# Patient Record
Sex: Female | Born: 1995 | Race: Black or African American | Hispanic: No | Marital: Single | Smoking: Never smoker
Health system: Southern US, Community
[De-identification: ages and names within clinical notes are randomized; demographics above are authoritative.]

## PROBLEM LIST (undated history)

## (undated) ENCOUNTER — Inpatient Hospital Stay (HOSPITAL_COMMUNITY): Payer: Self-pay

## (undated) DIAGNOSIS — J302 Other seasonal allergic rhinitis: Secondary | ICD-10-CM

## (undated) DIAGNOSIS — R519 Headache, unspecified: Secondary | ICD-10-CM

## (undated) DIAGNOSIS — J45909 Unspecified asthma, uncomplicated: Secondary | ICD-10-CM

## (undated) DIAGNOSIS — R87629 Unspecified abnormal cytological findings in specimens from vagina: Secondary | ICD-10-CM

## (undated) DIAGNOSIS — A4902 Methicillin resistant Staphylococcus aureus infection, unspecified site: Secondary | ICD-10-CM

## (undated) DIAGNOSIS — R011 Cardiac murmur, unspecified: Secondary | ICD-10-CM

## (undated) DIAGNOSIS — D649 Anemia, unspecified: Secondary | ICD-10-CM

## (undated) DIAGNOSIS — F419 Anxiety disorder, unspecified: Secondary | ICD-10-CM

## (undated) DIAGNOSIS — R51 Headache: Secondary | ICD-10-CM

## (undated) HISTORY — DX: Unspecified asthma, uncomplicated: J45.909

## (undated) HISTORY — DX: Anxiety disorder, unspecified: F41.9

## (undated) HISTORY — DX: Other seasonal allergic rhinitis: J30.2

---

## 2015-05-10 ENCOUNTER — Other Ambulatory Visit (HOSPITAL_COMMUNITY): Payer: Self-pay | Admitting: Obstetrics

## 2015-05-10 DIAGNOSIS — Z3491 Encounter for supervision of normal pregnancy, unspecified, first trimester: Secondary | ICD-10-CM

## 2015-05-15 ENCOUNTER — Ambulatory Visit (HOSPITAL_COMMUNITY)
Admission: RE | Admit: 2015-05-15 | Discharge: 2015-05-15 | Disposition: A | Discharge status: Home / Self Care | Payer: Medicaid Other | Source: Ambulatory Visit | Attending: Obstetrics | Admitting: Obstetrics

## 2015-05-15 DIAGNOSIS — Z3491 Encounter for supervision of normal pregnancy, unspecified, first trimester: Secondary | ICD-10-CM

## 2015-05-15 DIAGNOSIS — Z36 Encounter for antenatal screening of mother: Secondary | ICD-10-CM | POA: Insufficient documentation

## 2015-05-15 DIAGNOSIS — Z3A09 9 weeks gestation of pregnancy: Secondary | ICD-10-CM | POA: Diagnosis not present

## 2015-07-09 LAB — OB RESULTS CONSOLE ABO/RH: RH TYPE: POSITIVE

## 2015-07-09 LAB — OB RESULTS CONSOLE GC/CHLAMYDIA
Chlamydia: NEGATIVE
GC PROBE AMP, GENITAL: NEGATIVE

## 2015-07-09 LAB — OB RESULTS CONSOLE RPR: RPR: NONREACTIVE

## 2015-07-09 LAB — OB RESULTS CONSOLE ANTIBODY SCREEN: Antibody Screen: NEGATIVE

## 2015-07-09 LAB — OB RESULTS CONSOLE HIV ANTIBODY (ROUTINE TESTING): HIV: NONREACTIVE

## 2015-07-09 LAB — OB RESULTS CONSOLE RUBELLA ANTIBODY, IGM: Rubella: IMMUNE

## 2015-07-09 LAB — OB RESULTS CONSOLE HEPATITIS B SURFACE ANTIGEN: Hepatitis B Surface Ag: NEGATIVE

## 2015-08-27 ENCOUNTER — Other Ambulatory Visit (HOSPITAL_COMMUNITY): Payer: Self-pay | Admitting: Obstetrics and Gynecology

## 2015-08-27 DIAGNOSIS — IMO0002 Reserved for concepts with insufficient information to code with codable children: Secondary | ICD-10-CM

## 2015-08-27 DIAGNOSIS — Z3689 Encounter for other specified antenatal screening: Secondary | ICD-10-CM

## 2015-08-27 DIAGNOSIS — Z3A25 25 weeks gestation of pregnancy: Secondary | ICD-10-CM

## 2015-09-03 ENCOUNTER — Ambulatory Visit (HOSPITAL_COMMUNITY)
Admission: RE | Admit: 2015-09-03 | Discharge: 2015-09-03 | Disposition: A | Discharge status: Home / Self Care | Payer: Medicaid Other | Source: Ambulatory Visit | Attending: Obstetrics and Gynecology | Admitting: Obstetrics and Gynecology

## 2015-09-03 ENCOUNTER — Encounter (HOSPITAL_COMMUNITY): Payer: Self-pay

## 2015-09-03 DIAGNOSIS — O365990 Maternal care for other known or suspected poor fetal growth, unspecified trimester, not applicable or unspecified: Secondary | ICD-10-CM | POA: Diagnosis present

## 2015-09-03 DIAGNOSIS — Z3A25 25 weeks gestation of pregnancy: Secondary | ICD-10-CM

## 2015-09-03 DIAGNOSIS — IMO0002 Reserved for concepts with insufficient information to code with codable children: Secondary | ICD-10-CM

## 2015-09-03 DIAGNOSIS — Z3689 Encounter for other specified antenatal screening: Secondary | ICD-10-CM

## 2015-09-03 DIAGNOSIS — Z3A26 26 weeks gestation of pregnancy: Secondary | ICD-10-CM | POA: Diagnosis present

## 2015-09-03 DIAGNOSIS — Z36 Encounter for antenatal screening of mother: Secondary | ICD-10-CM | POA: Diagnosis not present

## 2015-09-04 ENCOUNTER — Other Ambulatory Visit (HOSPITAL_COMMUNITY): Payer: Self-pay | Admitting: Obstetrics and Gynecology

## 2015-09-04 DIAGNOSIS — IMO0002 Reserved for concepts with insufficient information to code with codable children: Secondary | ICD-10-CM

## 2015-09-04 DIAGNOSIS — Z0489 Encounter for examination and observation for other specified reasons: Secondary | ICD-10-CM

## 2015-09-27 ENCOUNTER — Other Ambulatory Visit (HOSPITAL_COMMUNITY): Payer: Self-pay | Admitting: Obstetrics and Gynecology

## 2015-09-27 ENCOUNTER — Ambulatory Visit (HOSPITAL_COMMUNITY)
Admission: RE | Admit: 2015-09-27 | Discharge: 2015-09-27 | Disposition: A | Discharge status: Home / Self Care | Payer: Medicaid Other | Source: Ambulatory Visit | Attending: Obstetrics and Gynecology | Admitting: Obstetrics and Gynecology

## 2015-09-27 ENCOUNTER — Encounter (HOSPITAL_COMMUNITY): Payer: Self-pay

## 2015-09-27 DIAGNOSIS — Z0489 Encounter for examination and observation for other specified reasons: Secondary | ICD-10-CM

## 2015-09-27 DIAGNOSIS — O99213 Obesity complicating pregnancy, third trimester: Secondary | ICD-10-CM | POA: Diagnosis not present

## 2015-09-27 DIAGNOSIS — Z36 Encounter for antenatal screening of mother: Secondary | ICD-10-CM | POA: Insufficient documentation

## 2015-09-27 DIAGNOSIS — Z3A28 28 weeks gestation of pregnancy: Secondary | ICD-10-CM | POA: Insufficient documentation

## 2015-09-27 DIAGNOSIS — IMO0002 Reserved for concepts with insufficient information to code with codable children: Secondary | ICD-10-CM

## 2015-11-05 ENCOUNTER — Encounter (HOSPITAL_COMMUNITY): Payer: Self-pay | Admitting: *Deleted

## 2015-11-05 ENCOUNTER — Inpatient Hospital Stay (HOSPITAL_COMMUNITY)
Admission: AD | Admit: 2015-11-05 | Discharge: 2015-11-05 | Disposition: A | Discharge status: Home / Self Care | Payer: Medicaid Other | Source: Ambulatory Visit | Attending: Obstetrics and Gynecology | Admitting: Obstetrics and Gynecology

## 2015-11-05 DIAGNOSIS — Z3A34 34 weeks gestation of pregnancy: Secondary | ICD-10-CM | POA: Diagnosis not present

## 2015-11-05 DIAGNOSIS — K529 Noninfective gastroenteritis and colitis, unspecified: Secondary | ICD-10-CM | POA: Insufficient documentation

## 2015-11-05 DIAGNOSIS — R112 Nausea with vomiting, unspecified: Secondary | ICD-10-CM | POA: Diagnosis present

## 2015-11-05 DIAGNOSIS — O26893 Other specified pregnancy related conditions, third trimester: Secondary | ICD-10-CM | POA: Insufficient documentation

## 2015-11-05 DIAGNOSIS — O219 Vomiting of pregnancy, unspecified: Secondary | ICD-10-CM

## 2015-11-05 LAB — CBC WITH DIFFERENTIAL/PLATELET
BASOS ABS: 0 10*3/uL (ref 0.0–0.1)
BASOS PCT: 0 %
Eosinophils Absolute: 0 10*3/uL (ref 0.0–0.7)
Eosinophils Relative: 0 %
HEMATOCRIT: 33 % — AB (ref 36.0–46.0)
Hemoglobin: 11.2 g/dL — ABNORMAL LOW (ref 12.0–15.0)
Lymphocytes Relative: 18 %
Lymphs Abs: 1.7 10*3/uL (ref 0.7–4.0)
MCH: 30.4 pg (ref 26.0–34.0)
MCHC: 33.9 g/dL (ref 30.0–36.0)
MCV: 89.7 fL (ref 78.0–100.0)
MONO ABS: 0.5 10*3/uL (ref 0.1–1.0)
Monocytes Relative: 6 %
NEUTROS ABS: 7.2 10*3/uL (ref 1.7–7.7)
NEUTROS PCT: 76 %
Platelets: 215 10*3/uL (ref 150–400)
RBC: 3.68 MIL/uL — ABNORMAL LOW (ref 3.87–5.11)
RDW: 13.6 % (ref 11.5–15.5)
WBC: 9.5 10*3/uL (ref 4.0–10.5)

## 2015-11-05 LAB — URINALYSIS, ROUTINE W REFLEX MICROSCOPIC
Bilirubin Urine: NEGATIVE
GLUCOSE, UA: NEGATIVE mg/dL
Ketones, ur: 15 mg/dL — AB
LEUKOCYTES UA: NEGATIVE
Nitrite: NEGATIVE
PH: 7 (ref 5.0–8.0)
PROTEIN: NEGATIVE mg/dL
Specific Gravity, Urine: 1.015 (ref 1.005–1.030)

## 2015-11-05 LAB — URINE MICROSCOPIC-ADD ON

## 2015-11-05 MED ORDER — LACTATED RINGERS IV BOLUS (SEPSIS)
1000.0000 mL | Freq: Once | INTRAVENOUS | Status: AC
  Administered 2015-11-05: 1000 mL via INTRAVENOUS

## 2015-11-05 NOTE — MAU Note (Signed)
Since left Wednesday she has been vomiting and having loose stools, 2 episodes of diarrhea in the last 3 days.  Cannot eat a lot at once.  Have not been able to drink as much as usual.  Has had intermittent tightening and cramping of her abdomen since this morning.  Denies lof/vb.

## 2015-11-05 NOTE — MAU Provider Note (Signed)
History    [name] [name] is a 19y.o. G1P0 at 34.3wks who presents, after phone call, for n/v, abdominal pain, and contractions.  Patient states she has been having symptoms since last Wednesday.  Patient denies recent vomiting and nausea and reports last loose stool today at 1000.  Patient also reports abdominal cramping that is relieved with burping or bowel movements. However, patient denies heartburn.  Patient endorses +fm, -vb, -ctx, and -lof.     There are no active problems to display for this patient.   No chief complaint on file.  HPI  OB History    Gravida Para Term Preterm AB TAB SAB Ectopic Multiple Living   1         0      Past Medical History  Diagnosis Date  . Medical history non-contributory     Past Surgical History  Procedure Laterality Date  . No past surgeries      History reviewed. No pertinent family history.  Social History  Substance Use Topics  . Smoking status: Never Smoker   . Smokeless tobacco: None  . Alcohol Use: No    Allergies: No Known Allergies  Prescriptions prior to admission  Medication Sig Dispense Refill Last Dose  . ondansetron (ZOFRAN-ODT) 4 MG disintegrating tablet Take 4 mg by mouth every 6 (six) hours as needed for nausea or vomiting.   11/04/2015 at Unknown time  . Prenatal Vit-Fe Fumarate-FA (PRENATAL VITAMIN PO) Take 1 tablet by mouth daily.    11/05/2015 at Unknown time    ROS  See HPI Above Physical Exam   Blood pressure 135/62, pulse 89, temperature 98.1 F (36.7 C), temperature source Oral, resp. rate 16, last menstrual period 03/01/2015.  Results for orders placed or performed during the hospital encounter of 11/05/15 (from the past 72 hour(s))  Urinalysis, Routine w reflex microscopic (not at [facility])     Status: Abnormal   Collection Time: 11/05/15  1:13 PM  Result Value Ref Range   Color, Urine YELLOW YELLOW   APPearance CLEAR CLEAR   Specific Gravity, Urine 1.015 1.005 - 1.030   pH 7.0 5.0 - 8.0   Glucose,  UA NEGATIVE NEGATIVE mg/dL   Hgb urine dipstick TRACE (A) NEGATIVE   Bilirubin Urine NEGATIVE NEGATIVE   Ketones, ur 15 (A) NEGATIVE mg/dL   Protein, ur NEGATIVE NEGATIVE mg/dL   Nitrite NEGATIVE NEGATIVE   Leukocytes, UA NEGATIVE NEGATIVE  Urine microscopic-add on     Status: Abnormal   Collection Time: 11/05/15  1:13 PM  Result Value Ref Range   Squamous Epithelial / LPF 6-30 (A) NONE SEEN   WBC, UA 0-5 0 - 5 WBC/hpf   RBC / HPF 0-5 0 - 5 RBC/hpf   Bacteria, UA FEW (A) NONE SEEN  CBC with Differential/Platelet     Status: Abnormal   Collection Time: 11/05/15  2:42 PM  Result Value Ref Range   WBC 9.5 4.0 - 10.5 K/uL   RBC 3.68 (L) 3.87 - 5.11 MIL/uL   Hemoglobin 11.2 (L) 12.0 - 15.0 g/dL   HCT 16.1 (L) 09.6 - 04.5 %   MCV 89.7 78.0 - 100.0 fL   MCH 30.4 26.0 - 34.0 pg   MCHC 33.9 30.0 - 36.0 g/dL   RDW 40.9 81.1 - 91.4 %   Platelets 215 150 - 400 K/uL   Neutrophils Relative % 76 %   Neutro Abs 7.2 1.7 - 7.7 K/uL   Lymphocytes Relative 18 %   Lymphs Abs 1.7  0.7 - 4.0 K/uL   Monocytes Relative 6 %   Monocytes Absolute 0.5 0.1 - 1.0 K/uL   Eosinophils Relative 0 %   Eosinophils Absolute 0.0 0.0 - 0.7 K/uL   Basophils Relative 0 %   Basophils Absolute 0.0 0.0 - 0.1 K/uL   Physical Exam  Vitals reviewed. Constitutional: She is oriented to person, place, and time. She appears well-developed and well-nourished. No distress.  HENT:  Head: Normocephalic and atraumatic.  Eyes: Conjunctivae are normal.  Neck: Normal range of motion.  Cardiovascular: Normal rate, regular rhythm and normal heart sounds.   Respiratory: Effort normal and breath sounds normal.  GI: Soft. Bowel sounds are normal.  Musculoskeletal: Normal range of motion. She exhibits no edema.  Neurological: She is alert and oriented to person, place, and time.  Skin: Skin is warm and dry.     FHR: 135 bpm, Mod Var, -Decels, +Accels UC: None graphed  ED Course  Assessment: IUP at 34.3wks Cat I  FT Gastroenteritis-Resolving   Plan: -PE as above -Labs; UA, CBC with Diff -IV bolus 1000mg  x 1 hr -Reassess   Follow Up (1619) -CBC as above -No s/s of infection -Discharge to home with precautions -Continue home mgmt as discussed -Keep appt as scheduled -Encouraged to call if any questions or concerns arise prior to next scheduled office visit.   Cherre RobinsJessica L Ajane Novella CNM, MSN 11/05/2015 2:17 PM

## 2015-11-15 LAB — OB RESULTS CONSOLE GBS: STREP GROUP B AG: POSITIVE

## 2015-12-16 ENCOUNTER — Inpatient Hospital Stay (HOSPITAL_COMMUNITY): Payer: Medicaid Other

## 2015-12-16 ENCOUNTER — Inpatient Hospital Stay (HOSPITAL_COMMUNITY)
Admission: AD | Admit: 2015-12-16 | Discharge: 2015-12-20 | DRG: 765 | Disposition: A | Discharge status: Home / Self Care | Payer: Medicaid Other | Source: Ambulatory Visit | Attending: Obstetrics and Gynecology | Admitting: Obstetrics and Gynecology

## 2015-12-16 ENCOUNTER — Inpatient Hospital Stay (HOSPITAL_COMMUNITY): Payer: Medicaid Other | Admitting: Anesthesiology

## 2015-12-16 ENCOUNTER — Encounter (HOSPITAL_COMMUNITY): Payer: Self-pay | Admitting: *Deleted

## 2015-12-16 DIAGNOSIS — Z6841 Body Mass Index (BMI) 40.0 and over, adult: Secondary | ICD-10-CM | POA: Diagnosis not present

## 2015-12-16 DIAGNOSIS — O99214 Obesity complicating childbirth: Secondary | ICD-10-CM | POA: Diagnosis present

## 2015-12-16 DIAGNOSIS — O99824 Streptococcus B carrier state complicating childbirth: Secondary | ICD-10-CM | POA: Diagnosis present

## 2015-12-16 DIAGNOSIS — Z3490 Encounter for supervision of normal pregnancy, unspecified, unspecified trimester: Secondary | ICD-10-CM

## 2015-12-16 DIAGNOSIS — O9902 Anemia complicating childbirth: Secondary | ICD-10-CM | POA: Diagnosis present

## 2015-12-16 DIAGNOSIS — D649 Anemia, unspecified: Secondary | ICD-10-CM | POA: Diagnosis present

## 2015-12-16 DIAGNOSIS — Z98891 History of uterine scar from previous surgery: Secondary | ICD-10-CM

## 2015-12-16 DIAGNOSIS — Z3A40 40 weeks gestation of pregnancy: Secondary | ICD-10-CM

## 2015-12-16 LAB — COMPREHENSIVE METABOLIC PANEL
ALBUMIN: 3.1 g/dL — AB (ref 3.5–5.0)
ALK PHOS: 135 U/L — AB (ref 38–126)
ALT: 15 U/L (ref 14–54)
AST: 19 U/L (ref 15–41)
Anion gap: 9 (ref 5–15)
CHLORIDE: 105 mmol/L (ref 101–111)
CO2: 24 mmol/L (ref 22–32)
Calcium: 9.1 mg/dL (ref 8.9–10.3)
Creatinine, Ser: 0.52 mg/dL (ref 0.44–1.00)
GFR calc Af Amer: >60 mL/min (ref >60)
GFR calc non Af Amer: >60 mL/min (ref >60)
GLUCOSE: 84 mg/dL (ref 65–99)
POTASSIUM: 3.8 mmol/L (ref 3.5–5.1)
SODIUM: 138 mmol/L (ref 135–145)
TOTAL PROTEIN: 6.6 g/dL (ref 6.5–8.1)
Total Bilirubin: 0.7 mg/dL (ref 0.3–1.2)

## 2015-12-16 LAB — CBC
HCT: 34.7 % — ABNORMAL LOW (ref 36.0–46.0)
HEMATOCRIT: 39.3 % (ref 36.0–46.0)
HEMOGLOBIN: 13.2 g/dL (ref 12.0–15.0)
HEMOGLOBIN: 11.4 g/dL — AB (ref 12.0–15.0)
MCH: 30.8 pg (ref 26.0–34.0)
MCH: 30.2 pg (ref 26.0–34.0)
MCHC: 33.6 g/dL (ref 30.0–36.0)
MCHC: 32.9 g/dL (ref 30.0–36.0)
MCV: 91.6 fL (ref 78.0–100.0)
MCV: 91.8 fL (ref 78.0–100.0)
PLATELETS: 206 10*3/uL (ref 150–400)
Platelets: 229 10*3/uL (ref 150–400)
RBC: 4.29 MIL/uL (ref 3.87–5.11)
RBC: 3.78 MIL/uL — AB (ref 3.87–5.11)
RDW: 14.3 % (ref 11.5–15.5)
RDW: 14.1 % (ref 11.5–15.5)
WBC: 9.9 10*3/uL (ref 4.0–10.5)
WBC: 10.1 10*3/uL (ref 4.0–10.5)

## 2015-12-16 LAB — TYPE AND SCREEN
ABO/RH(D): O POS
ANTIBODY SCREEN: NEGATIVE

## 2015-12-16 LAB — PROTEIN / CREATININE RATIO, URINE: Creatinine, Urine: 28 mg/dL

## 2015-12-16 LAB — URIC ACID: URIC ACID, SERUM: 3.7 mg/dL (ref 2.3–6.6)

## 2015-12-16 LAB — LACTATE DEHYDROGENASE: LDH: 134 U/L (ref 98–192)

## 2015-12-16 LAB — AMNISURE RUPTURE OF MEMBRANE (ROM) NOT AT ARMC: AMNISURE: NEGATIVE

## 2015-12-16 MED ORDER — FLEET ENEMA 7-19 GM/118ML RE ENEM: 1.0000 | ENEMA | RECTAL | Status: DC | PRN

## 2015-12-16 MED ORDER — CITRIC ACID-SODIUM CITRATE 334-500 MG/5ML PO SOLN
30.0000 mL | ORAL | Status: DC | PRN
  Administered 2015-12-17: 30 mL via ORAL
  Filled 2015-12-16: qty 15

## 2015-12-16 MED ORDER — LACTATED RINGERS IV SOLN
INTRAVENOUS | Status: DC
  Administered 2015-12-16: 1000 mL via INTRAVENOUS
  Administered 2015-12-16: 20:00:00 via INTRAVENOUS

## 2015-12-16 MED ORDER — OXYTOCIN BOLUS FROM INFUSION
500.0000 mL | INTRAVENOUS | Status: DC
  Administered 2015-12-17: 500 mL via INTRAVENOUS

## 2015-12-16 MED ORDER — ONDANSETRON HCL 4 MG/2ML IJ SOLN: 4.0000 mg | Freq: Four times a day (QID) | INTRAMUSCULAR | Status: DC | PRN

## 2015-12-16 MED ORDER — LACTATED RINGERS IV SOLN
INTRAVENOUS | Status: DC
  Administered 2015-12-16: 23:00:00 via INTRAUTERINE

## 2015-12-16 MED ORDER — ACETAMINOPHEN 325 MG PO TABS: 650.0000 mg | ORAL_TABLET | ORAL | Status: DC | PRN

## 2015-12-16 MED ORDER — LIDOCAINE HCL (PF) 1 % IJ SOLN
INTRAMUSCULAR | Status: DC | PRN
  Administered 2015-12-16: 2 mL
  Administered 2015-12-16: 3 mL
  Administered 2015-12-16: 5 mL

## 2015-12-16 MED ORDER — PENICILLIN G POTASSIUM 5000000 UNITS IJ SOLR
2.5000 10*6.[IU] | INTRAVENOUS | Status: DC
  Administered 2015-12-17: 2.5 10*6.[IU] via INTRAVENOUS
  Filled 2015-12-16 (×4): qty 2.5

## 2015-12-16 MED ORDER — OXYCODONE-ACETAMINOPHEN 5-325 MG PO TABS: 2.0000 | ORAL_TABLET | ORAL | Status: DC | PRN

## 2015-12-16 MED ORDER — NALBUPHINE HCL 10 MG/ML IJ SOLN: 10.0000 mg | INTRAMUSCULAR | Status: DC | PRN

## 2015-12-16 MED ORDER — OXYCODONE-ACETAMINOPHEN 5-325 MG PO TABS: 1.0000 | ORAL_TABLET | ORAL | Status: DC | PRN

## 2015-12-16 MED ORDER — FENTANYL CITRATE (PF) 100 MCG/2ML IJ SOLN
100.0000 ug | Freq: Once | INTRAMUSCULAR | Status: AC
  Administered 2015-12-16: 100 ug via INTRAVENOUS
  Filled 2015-12-16: qty 2

## 2015-12-16 MED ORDER — OXYTOCIN 40 UNITS IN LACTATED RINGERS INFUSION - SIMPLE MED: 2.5000 [IU]/h | INTRAVENOUS | Status: DC

## 2015-12-16 MED ORDER — LIDOCAINE HCL (PF) 1 % IJ SOLN: 30.0000 mL | INTRAMUSCULAR | Status: DC | PRN

## 2015-12-16 MED ORDER — FENTANYL 2.5 MCG/ML BUPIVACAINE 1/10 % EPIDURAL INFUSION (WH - ANES)
INTRAMUSCULAR | Status: AC
  Administered 2015-12-16: 14 mL/h via EPIDURAL
  Filled 2015-12-16: qty 125

## 2015-12-16 MED ORDER — PHENYLEPHRINE 40 MCG/ML (10ML) SYRINGE FOR IV PUSH (FOR BLOOD PRESSURE SUPPORT)
PREFILLED_SYRINGE | INTRAVENOUS | Status: AC
  Filled 2015-12-16: qty 20

## 2015-12-16 MED ORDER — LACTATED RINGERS IV SOLN: 500.0000 mL | INTRAVENOUS | Status: DC | PRN

## 2015-12-16 MED ORDER — PENICILLIN G POTASSIUM 5000000 UNITS IJ SOLR
5.0000 10*6.[IU] | Freq: Once | INTRAVENOUS | Status: AC
  Administered 2015-12-16: 5 10*6.[IU] via INTRAVENOUS
  Filled 2015-12-16: qty 5

## 2015-12-16 NOTE — Progress Notes (Signed)
Return from US

## 2015-12-16 NOTE — Anesthesia Preprocedure Evaluation (Signed)
Anesthesia Evaluation  Patient identified by MRN, date of birth, ID band Patient awake    Reviewed: Allergy & Precautions, Patient's Chart, lab work & pertinent test results  Airway Mallampati: II       Dental   Pulmonary neg pulmonary ROS,    Pulmonary exam normal        Cardiovascular negative cardio ROS Normal cardiovascular exam     Neuro/Psych negative neurological ROS     GI/Hepatic negative GI ROS, Neg liver ROS,   Endo/Other  Morbid obesity  Renal/GU negative Renal ROS     Musculoskeletal   Abdominal   Peds  Hematology negative hematology ROS (+)   Anesthesia Other Findings   Reproductive/Obstetrics                             Lab Results  Component Value Date   WBC 9.9 12/16/2015   HGB 13.2 12/16/2015   HCT 39.3 12/16/2015   MCV 91.6 12/16/2015   PLT 229 12/16/2015    Anesthesia Physical Anesthesia Plan  ASA: III  Anesthesia Plan: Epidural   Post-op Pain Management:    Induction:   Airway Management Planned: Natural Airway  Additional Equipment:   Intra-op Plan:   Post-operative Plan:   Informed Consent: I have reviewed the patients History and Physical, chart, labs and discussed the procedure including the risks, benefits and alternatives for the proposed anesthesia with the patient or authorized representative who has indicated his/her understanding and acceptance.     Plan Discussed with:   Anesthesia Plan Comments:         Anesthesia Quick Evaluation

## 2015-12-16 NOTE — Progress Notes (Signed)
Informed Dr Normand Sloopillard of neg lab result, she orders BPP

## 2015-12-16 NOTE — Progress Notes (Signed)
Informed Dr Normand Sloopillard of BPP results . Orders rec to admit start IV

## 2015-12-16 NOTE — Progress Notes (Signed)
To US

## 2015-12-16 NOTE — Anesthesia Procedure Notes (Signed)
Epidural Patient location during procedure: OB  Staffing Anesthesiologist: Marcene DuosFITZGERALD, Auden Tatar Performed by: anesthesiologist   Preanesthetic Checklist Completed: patient identified, site marked, surgical consent, pre-op evaluation, timeout performed, IV checked, risks and benefits discussed and monitors and equipment checked  Epidural Patient position: sitting Prep: site prepped and draped and DuraPrep Patient monitoring: continuous pulse ox and blood pressure Approach: midline Location: L4-L5 Injection technique: LOR air  Needle:  Needle type: Tuohy  Needle gauge: 17 G Needle length: 9 cm and 9 Catheter type: closed end flexible Catheter size: 19 Gauge Catheter at skin depth: 15 (14cm initially. Advanced to 15cm when laid in right lat decub.) cm Test dose: negative  Assessment Events: blood not aspirated, injection not painful, no injection resistance, negative IV test and no paresthesia

## 2015-12-16 NOTE — H&P (Signed)
[name] [name] is a 20 y.o. female presenting for contractions. Pt had some variables on the her NST. She went for a BPP and it was 4/10.  He received 2 points for fluid and NST. Pt also thought she had LOF.  amnisure was negative and no pooling on speculum exam.   History OB History    Gravida Para Term Preterm AB TAB SAB Ectopic Multiple Living   1 0 0 0 0 0 0 0 0 0      Past Medical History  Diagnosis Date  . Medical history non-contributory    Past Surgical History  Procedure Laterality Date  . No past surgeries     Family History: family history is not on file. Social History:  reports that she has never smoked. She does not have any smokeless tobacco history on file. She reports that she does not drink alcohol or use illicit drugs.   Prenatal Transfer Tool  Maternal Diabetes: No Genetic Screening: Declined Maternal Ultrasounds/Referrals: Normal Fetal Ultrasounds or other Referrals:  Referred to Materal Fetal Medicine  Maternal Substance Abuse:  No Significant Maternal Medications:  None Significant Maternal Lab Results:  None Other Comments:  Pt went to MFM for fu of HC lag.  growth over the pregnancy normalized   ROS  Dilation: Closed Exam by:: Dr [name] [name] Blood pressure 147/81, pulse 117, temperature 98.9 F (37.2 C), temperature source Oral, height 5\' 5"  (1.651 m), weight 255 lb (115.667 kg), last menstrual period 03/01/2015. Exam Physical Exam  Physical Examination: General appearance - alert, well appearing, and in no distress Mental status - alert, oriented to person, place, and time Chest - clear to auscultation, no wheezes, rales or rhonchi, symmetric air entry Heart - normal rate and regular rhythm Abdomen - soft, nontender, nondistended, no masses or organomegaly gravid Pelvic - 1-2/50/-3 vtx Extremities - Homan's sign negative bilaterally Prenatal labs: ABO, Rh: O/Positive/-- (12/13 0000) Antibody: Negative (12/13 0000) Rubella: Immune (12/13 0000) RPR:  Nonreactive (12/13 0000)  HBsAg: Negative (12/13 0000)  HIV: Non-reactive (12/13 0000)  GBS: Positive (04/21 0000)   Assessment/Plan: BPP 4/10 Latent labor Pt offered CS or induction with repeat of BPP at 11 pm She chose the lattter Foley catheter placed in the cervix Pain meds prn NST cat 1 with ctx 2-5 min and some cervical change   [name] [name] A 12/16/2015, 6:27 PM

## 2015-12-16 NOTE — Progress Notes (Signed)
Assuming care of [name] [name], 20 yo G1P0 @ 40.2 wks admitted for IOL due to BPP 4/10 (received 2 points for fluid and NST). Family at bedside.    Subjective: Crying in pain. Desires epidural. +FM. Denies LOF or VB.  Objective: BP 139/77 mmHg  Pulse 91  Temp(Src) 98.3 F (36.8 C) (Oral)  Resp 20  Ht 5\' 5"  (1.651 m)  Wt 115.667 kg (255 lb)  BMI 42.43 kg/m2  SpO2 100%  LMP 03/01/2015 Today's Vitals   12/16/15 2040 12/16/15 2044 12/16/15 2045 12/16/15 2047  BP:  140/81  139/77  Pulse: 93 97 88 91  Temp:      TempSrc:      Resp:  20  20  Height:      Weight:      SpO2: 100% 100% 100%   PainSc:        FHT: BL 135 w/ mod variability, 10x10 accels, occ sharp variable UC:   irregular, every 2-3 minutes SVE:   Dilation: 4.5 Effacement (%): 60 Station: -3 Exam by:: Elana AlmElizabeth Cone RNC@ 1935 -- Foley bulb out  Assessment:  IUP at 40.2 wks IOL due to non-reassuring fetal testing Overall reassuring FHRT GBS positive  Plan: Epidural Continue induction PCN for GBS prophylaxis  Sherre ScarletWILLIAMS, Kyon Bentler CNM 12/16/2015, 8:55 PM

## 2015-12-16 NOTE — Progress Notes (Signed)
Foley inserted by Dr dillard 60 cc LR inserted

## 2015-12-16 NOTE — Progress Notes (Addendum)
  Subjective: Comfortable w/ epidural. ? LOF. +FM. Denies h/a, visual changes, epigastric pain, difficulty breathing or VB.   Objective: BP 140/67 mmHg  Pulse 79  Temp(Src) 98 F (36.7 C) (Oral)  Resp 20  Ht 5\' 5"  (1.651 m)  Wt 255 lb (115.667 kg)  BMI 42.43 kg/m2  SpO2 100%  LMP 03/01/2015 Today's Vitals   12/16/15 2116 12/16/15 2117 12/16/15 2139 12/16/15 2253  BP:  141/67 140/67   Pulse: 84 86 79 83  Temp:   98 F (36.7 C)   TempSrc:   Oral   Resp:  20 20   Height:      Weight:      SpO2: 100%   99%  PainSc:  0-No pain 0-No pain    Gen: NAD Lungs: CTAB CV: RRR w/o M/R/G Abdomen: gravid, soft btw ctxs Ext: 2+ brachial, no clonus, 1+ calf/pedal edema FHT: BL 140 w/ min variability, +scalp stim, no lates, occ mod variables, + earlys UC:   irregular, every 2-3 minutes SVE: 4-5/60/-3 @ 2232. AROM'd, small-mod amount of clear fluid noted. IUPC and FSE placed   Assessment:  IUP at 40.2 wks IOL due to BPP 4/10 Overall reassuring FHRT Neg CST Elevated BPs GBS positive  Plan: preE labs + urine PCR now Amnioinfusion Position change 02 500 ml LR bolus Repeat BPP as previously ordered CTO closely Dr. Dion BodyVarnado updated  Sherre ScarletWILLIAMS, Tashayla Therien CNM 12/16/2015, 10:44 PM

## 2015-12-16 NOTE — Anesthesia Pain Management Evaluation Note (Signed)
  CRNA Pain Management Visit Note  Patient: Baruch Goutysther Enfield, 20 y.o., female  "Hello I am a [facility] the [facility]. We have an anesthesia team available at all times to provide care throughout the hospital, including epidural management and anesthesia for C-section. I don't know your plan for the delivery whether it a natural birth, water birth, IV sedation, nitrous supplementation, doula or epidural, but we want to meet your pain goals."   1.Was your pain managed to your expectations on prior hospitalizations?   No prior hospitalizations  2.What is your expectation for pain management during this hospitalization?     Epidural  3.How can we help you reach that goal? epidural  Record the patient's initial score and the patient's pain goal.   Pain: 0  Pain Goal: 4 [facility] wants you to be able to say your pain was always managed very well.  Honest Vanleer 12/16/2015

## 2015-12-17 ENCOUNTER — Encounter (HOSPITAL_COMMUNITY): Payer: Self-pay | Admitting: *Deleted

## 2015-12-17 ENCOUNTER — Encounter (HOSPITAL_COMMUNITY)
Admission: AD | Disposition: A | Discharge status: Home / Self Care | Payer: Self-pay | Source: Ambulatory Visit | Attending: Obstetrics and Gynecology

## 2015-12-17 DIAGNOSIS — D649 Anemia, unspecified: Secondary | ICD-10-CM

## 2015-12-17 DIAGNOSIS — Z98891 History of uterine scar from previous surgery: Secondary | ICD-10-CM

## 2015-12-17 LAB — ABO/RH: ABO/RH(D): O POS

## 2015-12-17 LAB — RPR: RPR: NONREACTIVE

## 2015-12-17 SURGERY — Surgical Case
Anesthesia: Epidural

## 2015-12-17 MED ORDER — DIBUCAINE 1 % RE OINT: 1.0000 "application " | TOPICAL_OINTMENT | RECTAL | Status: DC | PRN

## 2015-12-17 MED ORDER — OXYCODONE-ACETAMINOPHEN 5-325 MG PO TABS: 2.0000 | ORAL_TABLET | ORAL | Status: DC | PRN

## 2015-12-17 MED ORDER — METOCLOPRAMIDE HCL 5 MG/ML IJ SOLN
INTRAMUSCULAR | Status: AC
  Filled 2015-12-17: qty 2

## 2015-12-17 MED ORDER — DIPHENHYDRAMINE HCL 25 MG PO CAPS: 25.0000 mg | ORAL_CAPSULE | ORAL | Status: DC | PRN

## 2015-12-17 MED ORDER — OXYCODONE-ACETAMINOPHEN 5-325 MG PO TABS
1.0000 | ORAL_TABLET | ORAL | Status: DC | PRN
  Administered 2015-12-18 – 2015-12-20 (×9): 1 via ORAL
  Filled 2015-12-17 (×9): qty 1

## 2015-12-17 MED ORDER — IBUPROFEN 600 MG PO TABS: 600.0000 mg | ORAL_TABLET | Freq: Four times a day (QID) | ORAL | Status: DC

## 2015-12-17 MED ORDER — KETOROLAC TROMETHAMINE 30 MG/ML IJ SOLN
INTRAMUSCULAR | Status: AC
  Administered 2015-12-17: 30 mg via INTRAMUSCULAR
  Filled 2015-12-17: qty 1

## 2015-12-17 MED ORDER — SODIUM CHLORIDE 0.9 % IR SOLN
Status: DC | PRN
  Administered 2015-12-17: 2000 mL

## 2015-12-17 MED ORDER — PRENATAL MULTIVITAMIN CH: 1.0000 | ORAL_TABLET | Freq: Every day | ORAL | Status: DC

## 2015-12-17 MED ORDER — OXYTOCIN 10 UNIT/ML IJ SOLN
INTRAMUSCULAR | Status: AC
Start: 2015-12-17 — End: 2015-12-17
  Filled 2015-12-17: qty 4

## 2015-12-17 MED ORDER — MORPHINE SULFATE (PF) 0.5 MG/ML IJ SOLN
INTRAMUSCULAR | Status: DC | PRN
  Administered 2015-12-17: 4 mg via EPIDURAL

## 2015-12-17 MED ORDER — CEFAZOLIN SODIUM-DEXTROSE 2-4 GM/100ML-% IV SOLN: 2.0000 g | Freq: Once | INTRAVENOUS | Status: DC

## 2015-12-17 MED ORDER — ONDANSETRON HCL 4 MG/2ML IJ SOLN
INTRAMUSCULAR | Status: AC
  Filled 2015-12-17: qty 2

## 2015-12-17 MED ORDER — LIDOCAINE-EPINEPHRINE (PF) 2 %-1:200000 IJ SOLN
INTRAMUSCULAR | Status: DC | PRN
  Administered 2015-12-17 (×2): 5 mL via EPIDURAL

## 2015-12-17 MED ORDER — OXYCODONE-ACETAMINOPHEN 5-325 MG PO TABS: 1.0000 | ORAL_TABLET | ORAL | Status: DC | PRN

## 2015-12-17 MED ORDER — OXYTOCIN 40 UNITS IN LACTATED RINGERS INFUSION - SIMPLE MED: 2.5000 [IU]/h | INTRAVENOUS | Status: DC

## 2015-12-17 MED ORDER — OXYTOCIN 40 UNITS IN LACTATED RINGERS INFUSION - SIMPLE MED: 2.5000 [IU]/h | INTRAVENOUS | Status: AC

## 2015-12-17 MED ORDER — COCONUT OIL OIL
1.0000 | TOPICAL_OIL | Status: DC | PRN
Start: 2015-12-17 — End: 2015-12-20

## 2015-12-17 MED ORDER — DEXAMETHASONE SODIUM PHOSPHATE 4 MG/ML IJ SOLN
INTRAMUSCULAR | Status: AC
  Filled 2015-12-17: qty 1

## 2015-12-17 MED ORDER — KETOROLAC TROMETHAMINE 30 MG/ML IJ SOLN: 30.0000 mg | Freq: Four times a day (QID) | INTRAMUSCULAR | Status: AC | PRN

## 2015-12-17 MED ORDER — PHENYLEPHRINE 40 MCG/ML (10ML) SYRINGE FOR IV PUSH (FOR BLOOD PRESSURE SUPPORT)
80.0000 ug | PREFILLED_SYRINGE | INTRAVENOUS | Status: DC | PRN
  Filled 2015-12-17: qty 10

## 2015-12-17 MED ORDER — LACTATED RINGERS IV SOLN
INTRAVENOUS | Status: DC
  Administered 2015-12-17: 18:00:00 via INTRAVENOUS
  Administered 2015-12-17: 125 mL/h via INTRAVENOUS

## 2015-12-17 MED ORDER — NALBUPHINE HCL 10 MG/ML IJ SOLN: 5.0000 mg | INTRAMUSCULAR | Status: DC | PRN

## 2015-12-17 MED ORDER — MEPERIDINE HCL 25 MG/ML IJ SOLN
INTRAMUSCULAR | Status: DC | PRN
  Administered 2015-12-17 (×2): 12.5 mg via INTRAVENOUS

## 2015-12-17 MED ORDER — MEPERIDINE HCL 25 MG/ML IJ SOLN: 6.2500 mg | INTRAMUSCULAR | Status: DC | PRN

## 2015-12-17 MED ORDER — SIMETHICONE 80 MG PO CHEW: 80.0000 mg | CHEWABLE_TABLET | ORAL | Status: DC | PRN

## 2015-12-17 MED ORDER — ACETAMINOPHEN 325 MG PO TABS: 650.0000 mg | ORAL_TABLET | ORAL | Status: DC | PRN

## 2015-12-17 MED ORDER — WITCH HAZEL-GLYCERIN EX PADS: 1.0000 "application " | MEDICATED_PAD | CUTANEOUS | Status: DC | PRN

## 2015-12-17 MED ORDER — IBUPROFEN 600 MG PO TABS
600.0000 mg | ORAL_TABLET | Freq: Four times a day (QID) | ORAL | Status: DC
  Administered 2015-12-17 – 2015-12-20 (×12): 600 mg via ORAL
  Filled 2015-12-17 (×12): qty 1

## 2015-12-17 MED ORDER — NALOXONE HCL 2 MG/2ML IJ SOSY: 1.0000 ug/kg/h | PREFILLED_SYRINGE | INTRAVENOUS | Status: DC | PRN

## 2015-12-17 MED ORDER — TETANUS-DIPHTH-ACELL PERTUSSIS 5-2.5-18.5 LF-MCG/0.5 IM SUSP
0.5000 mL | Freq: Once | INTRAMUSCULAR | Status: AC
  Administered 2015-12-18: 0.5 mL via INTRAMUSCULAR
  Filled 2015-12-17: qty 0.5

## 2015-12-17 MED ORDER — NALBUPHINE HCL 10 MG/ML IJ SOLN: 5.0000 mg | Freq: Once | INTRAMUSCULAR | Status: DC | PRN

## 2015-12-17 MED ORDER — TETANUS-DIPHTH-ACELL PERTUSSIS 5-2.5-18.5 LF-MCG/0.5 IM SUSP: 0.5000 mL | Freq: Once | INTRAMUSCULAR | Status: DC

## 2015-12-17 MED ORDER — MORPHINE SULFATE (PF) 0.5 MG/ML IJ SOLN
INTRAMUSCULAR | Status: AC
  Filled 2015-12-17: qty 10

## 2015-12-17 MED ORDER — ONDANSETRON HCL 4 MG/2ML IJ SOLN: 4.0000 mg | Freq: Three times a day (TID) | INTRAMUSCULAR | Status: DC | PRN

## 2015-12-17 MED ORDER — NALOXONE HCL 0.4 MG/ML IJ SOLN: 0.4000 mg | INTRAMUSCULAR | Status: DC | PRN

## 2015-12-17 MED ORDER — TERBUTALINE SULFATE 1 MG/ML IJ SOLN
0.2500 mg | Freq: Once | INTRAMUSCULAR | Status: AC
  Administered 2015-12-17: 0.25 mg via SUBCUTANEOUS

## 2015-12-17 MED ORDER — SIMETHICONE 80 MG PO CHEW: 80.0000 mg | CHEWABLE_TABLET | ORAL | Status: DC

## 2015-12-17 MED ORDER — FERROUS SULFATE 325 (65 FE) MG PO TABS
325.0000 mg | ORAL_TABLET | Freq: Two times a day (BID) | ORAL | Status: DC
  Administered 2015-12-17 – 2015-12-19 (×6): 325 mg via ORAL
  Filled 2015-12-17 (×6): qty 1

## 2015-12-17 MED ORDER — ONDANSETRON HCL 4 MG/2ML IJ SOLN
INTRAMUSCULAR | Status: DC | PRN
  Administered 2015-12-17: 4 mg via INTRAVENOUS

## 2015-12-17 MED ORDER — COCONUT OIL OIL: 1.0000 "application " | TOPICAL_OIL | Status: DC | PRN

## 2015-12-17 MED ORDER — FERROUS SULFATE 325 (65 FE) MG PO TABS: 325.0000 mg | ORAL_TABLET | Freq: Two times a day (BID) | ORAL | Status: DC

## 2015-12-17 MED ORDER — DIPHENHYDRAMINE HCL 50 MG/ML IJ SOLN: 12.5000 mg | INTRAMUSCULAR | Status: DC | PRN

## 2015-12-17 MED ORDER — KETOROLAC TROMETHAMINE 30 MG/ML IJ SOLN
30.0000 mg | Freq: Four times a day (QID) | INTRAMUSCULAR | Status: AC | PRN
  Administered 2015-12-17: 30 mg via INTRAMUSCULAR

## 2015-12-17 MED ORDER — SCOPOLAMINE 1 MG/3DAYS TD PT72
MEDICATED_PATCH | TRANSDERMAL | Status: DC | PRN
  Administered 2015-12-17: 1 via TRANSDERMAL

## 2015-12-17 MED ORDER — SCOPOLAMINE 1 MG/3DAYS TD PT72
1.0000 | MEDICATED_PATCH | Freq: Once | TRANSDERMAL | Status: DC
  Filled 2015-12-17: qty 1

## 2015-12-17 MED ORDER — DIPHENHYDRAMINE HCL 25 MG PO CAPS
25.0000 mg | ORAL_CAPSULE | Freq: Four times a day (QID) | ORAL | Status: DC | PRN
  Administered 2015-12-17: 25 mg via ORAL
  Filled 2015-12-17: qty 1

## 2015-12-17 MED ORDER — ZOLPIDEM TARTRATE 5 MG PO TABS: 5.0000 mg | ORAL_TABLET | Freq: Every evening | ORAL | Status: DC | PRN

## 2015-12-17 MED ORDER — CITRIC ACID-SODIUM CITRATE 334-500 MG/5ML PO SOLN: 30.0000 mL | ORAL | Status: DC

## 2015-12-17 MED ORDER — MEPERIDINE HCL 25 MG/ML IJ SOLN
INTRAMUSCULAR | Status: AC
  Filled 2015-12-17: qty 1

## 2015-12-17 MED ORDER — SIMETHICONE 80 MG PO CHEW
80.0000 mg | CHEWABLE_TABLET | Freq: Three times a day (TID) | ORAL | Status: DC
  Administered 2015-12-17 – 2015-12-19 (×7): 80 mg via ORAL
  Filled 2015-12-17 (×9): qty 1

## 2015-12-17 MED ORDER — SODIUM CHLORIDE 0.9% FLUSH: 3.0000 mL | INTRAVENOUS | Status: DC | PRN

## 2015-12-17 MED ORDER — TERBUTALINE SULFATE 1 MG/ML IJ SOLN
INTRAMUSCULAR | Status: AC
  Administered 2015-12-17: 0.25 mg via SUBCUTANEOUS
  Filled 2015-12-17: qty 1

## 2015-12-17 MED ORDER — LACTATED RINGERS IV SOLN
INTRAVENOUS | Status: DC | PRN
  Administered 2015-12-17 (×3): via INTRAVENOUS

## 2015-12-17 MED ORDER — LACTATED RINGERS IV SOLN: 500.0000 mL | Freq: Once | INTRAVENOUS | Status: DC

## 2015-12-17 MED ORDER — DIPHENHYDRAMINE HCL 25 MG PO CAPS: 25.0000 mg | ORAL_CAPSULE | Freq: Four times a day (QID) | ORAL | Status: DC | PRN

## 2015-12-17 MED ORDER — MENTHOL 3 MG MT LOZG: 1.0000 | LOZENGE | OROMUCOSAL | Status: DC | PRN

## 2015-12-17 MED ORDER — SIMETHICONE 80 MG PO CHEW
80.0000 mg | CHEWABLE_TABLET | ORAL | Status: DC | PRN
  Administered 2015-12-18: 80 mg via ORAL

## 2015-12-17 MED ORDER — DEXAMETHASONE SODIUM PHOSPHATE 4 MG/ML IJ SOLN
INTRAMUSCULAR | Status: DC | PRN
  Administered 2015-12-17: 4 mg via INTRAVENOUS

## 2015-12-17 MED ORDER — FENTANYL 2.5 MCG/ML BUPIVACAINE 1/10 % EPIDURAL INFUSION (WH - ANES)
14.0000 mL/h | INTRAMUSCULAR | Status: DC | PRN
  Administered 2015-12-16 – 2015-12-17 (×2): 14 mL/h via EPIDURAL
  Filled 2015-12-17: qty 125

## 2015-12-17 MED ORDER — SODIUM CHLORIDE 0.9 % IV SOLN
10000.0000 ug | INTRAVENOUS | Status: DC | PRN
  Administered 2015-12-17: 80 ug via INTRAVENOUS
  Administered 2015-12-17: 40 ug via INTRAVENOUS
  Administered 2015-12-17 (×6): 80 ug via INTRAVENOUS
  Administered 2015-12-17: 40 ug via INTRAVENOUS
  Administered 2015-12-17: 80 ug via INTRAVENOUS

## 2015-12-17 MED ORDER — PHENYLEPHRINE 40 MCG/ML (10ML) SYRINGE FOR IV PUSH (FOR BLOOD PRESSURE SUPPORT)
80.0000 ug | PREFILLED_SYRINGE | INTRAVENOUS | Status: DC | PRN
  Administered 2015-12-17: 80 ug via INTRAVENOUS

## 2015-12-17 MED ORDER — EPHEDRINE 5 MG/ML INJ: 10.0000 mg | INTRAVENOUS | Status: DC | PRN

## 2015-12-17 MED ORDER — METOCLOPRAMIDE HCL 5 MG/ML IJ SOLN
INTRAMUSCULAR | Status: DC | PRN
  Administered 2015-12-17: 10 mg via INTRAVENOUS

## 2015-12-17 MED ORDER — PRENATAL MULTIVITAMIN CH
1.0000 | ORAL_TABLET | Freq: Every day | ORAL | Status: DC
  Administered 2015-12-17 – 2015-12-19 (×3): 1 via ORAL
  Filled 2015-12-17 (×3): qty 1

## 2015-12-17 MED ORDER — SENNOSIDES-DOCUSATE SODIUM 8.6-50 MG PO TABS
2.0000 | ORAL_TABLET | ORAL | Status: DC
Start: 2015-12-18 — End: 2015-12-20
  Administered 2015-12-17: 2 via ORAL
  Filled 2015-12-17 (×2): qty 2

## 2015-12-17 MED ORDER — SIMETHICONE 80 MG PO CHEW
80.0000 mg | CHEWABLE_TABLET | ORAL | Status: DC
  Administered 2015-12-17 – 2015-12-20 (×3): 80 mg via ORAL
  Filled 2015-12-17 (×3): qty 1

## 2015-12-17 MED ORDER — LACTATED RINGERS IV SOLN: INTRAVENOUS | Status: DC

## 2015-12-17 MED ORDER — SIMETHICONE 80 MG PO CHEW: 80.0000 mg | CHEWABLE_TABLET | Freq: Three times a day (TID) | ORAL | Status: DC

## 2015-12-17 MED ORDER — SENNOSIDES-DOCUSATE SODIUM 8.6-50 MG PO TABS: 2.0000 | ORAL_TABLET | ORAL | Status: DC

## 2015-12-17 SURGICAL SUPPLY — 35 items
BARRIER ADHS 3X4 INTERCEED (GAUZE/BANDAGES/DRESSINGS) ×2 IMPLANT
BENZOIN TINCTURE PRP APPL 2/3 (GAUZE/BANDAGES/DRESSINGS) ×2 IMPLANT
CHLORAPREP W/TINT 26ML (MISCELLANEOUS) ×2 IMPLANT
CLAMP CORD UMBIL (MISCELLANEOUS) ×2 IMPLANT
CLOTH BEACON ORANGE TIMEOUT ST (SAFETY) ×2 IMPLANT
DRSG OPSITE POSTOP 4X10 (GAUZE/BANDAGES/DRESSINGS) ×2 IMPLANT
ELECT REM PT RETURN 9FT ADLT (ELECTROSURGICAL) ×2
ELECTRODE REM PT RTRN 9FT ADLT (ELECTROSURGICAL) ×1 IMPLANT
EXTRACTOR VACUUM BELL STYLE (SUCTIONS) IMPLANT
GLOVE BIO SURGEON STRL SZ7 (GLOVE) ×4 IMPLANT
GLOVE BIOGEL PI IND STRL 7.0 (GLOVE) ×4 IMPLANT
GLOVE BIOGEL PI INDICATOR 7.0 (GLOVE) ×4
GOWN STRL REUS W/TWL LRG LVL3 (GOWN DISPOSABLE) ×6 IMPLANT
KIT ABG SYR 3ML LUER SLIP (SYRINGE) IMPLANT
LIQUID BAND (GAUZE/BANDAGES/DRESSINGS) IMPLANT
NEEDLE HYPO 25X5/8 SAFETYGLIDE (NEEDLE) IMPLANT
NS IRRIG 1000ML POUR BTL (IV SOLUTION) ×2 IMPLANT
PACK C SECTION WH (CUSTOM PROCEDURE TRAY) ×2 IMPLANT
PAD ABD 8X10 STRL (GAUZE/BANDAGES/DRESSINGS) ×2 IMPLANT
PAD OB MATERNITY 4.3X12.25 (PERSONAL CARE ITEMS) ×2 IMPLANT
PENCIL SMOKE EVAC W/HOLSTER (ELECTROSURGICAL) ×2 IMPLANT
RTRCTR C-SECT PINK 25CM LRG (MISCELLANEOUS) ×2 IMPLANT
SPONGE GAUZE 4X4 12PLY (GAUZE/BANDAGES/DRESSINGS) ×4 IMPLANT
STRIP CLOSURE SKIN 1/2X4 (GAUZE/BANDAGES/DRESSINGS) ×2 IMPLANT
SUT CHROMIC 0 CTX 36 (SUTURE) IMPLANT
SUT MON AB 4-0 PS1 27 (SUTURE) ×2 IMPLANT
SUT PLAIN 0 NONE (SUTURE) IMPLANT
SUT PLAIN 2 0 XLH (SUTURE) ×2 IMPLANT
SUT VIC AB 0 CTX 36 (SUTURE) ×5
SUT VIC AB 0 CTX36XBRD ANBCTRL (SUTURE) ×5 IMPLANT
SUT VIC AB 2-0 CT1 27 (SUTURE) ×1
SUT VIC AB 2-0 CT1 TAPERPNT 27 (SUTURE) ×1 IMPLANT
TAPE CLOTH SURG 4X10 WHT LF (GAUZE/BANDAGES/DRESSINGS) ×2 IMPLANT
TOWEL OR 17X24 6PK STRL BLUE (TOWEL DISPOSABLE) ×4 IMPLANT
TRAY FOLEY CATH SILVER 14FR (SET/KITS/TRAYS/PACK) IMPLANT

## 2015-12-17 NOTE — Brief Op Note (Signed)
12/16/2015 - 12/17/2015  5:14 AM  PATIENT:  Baruch GoutyEsther Kniss  20 y.o. female  PRE-OPERATIVE DIAGNOSIS:  IUP @ 40 3/7 weeks, nonreassuring fetal tracing  POST-OPERATIVE DIAGNOSIS:  Same, nuchal cord x 1, ROT  PROCEDURE:  Procedure(s): CESAREAN SECTION (N/A), Primary 2 layer closure  SURGEON:  Surgeon(s) and Role:    * Geryl RankinsEvelyn Dontel Harshberger, MD - Primary  PHYSICIAN ASSISTANT:   ASSISTANTS: Sherre ScarletKimberly Williams, CNM   ANESTHESIA:   epidural  EBL:  Total I/O In: 1400 [I.V.:1400] Out: 2575 [Urine:1875; Blood:700]  BLOOD ADMINISTERED:none  DRAINS: Urinary Catheter (Foley)   LOCAL MEDICATIONS USED:  NONE  SPECIMEN:  Source of Specimen:  Placenta  DISPOSITION OF SPECIMEN:  PATHOLOGY  COUNTS:  YES  TOURNIQUET:  * No tourniquets in log *  DICTATION: .Other Dictation: Dictation Number [id number]  PLAN OF CARE: Transfer to Postpartum after PACU  PATIENT DISPOSITION:  PACU - hemodynamically stable.   Delay start of Pharmacological VTE agent (>24hrs) due to surgical blood loss or risk of bleeding: yes

## 2015-12-17 NOTE — Lactation Note (Signed)
This note was copied from a baby's chart. Lactation Consultation Note  Patient Name: [name] [name] ZOXWR'UToday's Date: 12/17/2015 Reason for consult: Initial assessment   Initial consult with first time mom of 12 hour old infant. Infant with 2 BF for 30 and 40 minutes, 2 attempts, 1 void and 2 stools since birth. LATCH Scores 5 and 8 by bedside RN.   Mom reports she feels infant is BF well. She says she needs more practice with latching infant. Reviewed BF Basics with mom and enc her to call for assistance as needed. Mom denies pain with latches.  Mom with large soft compressible breasts/areolas and everted nipples, nipples with divot in center of nipple. Mom was able to hand express with instruction and large gtts colostrum noted from each breast. Enc mom to place infant STS and feed infant 8-12 x in 24 hours at first feeding cues. Discussed with mom spoon feeding and enc her to hand express and spoon feed if infant too sleepy to awaken to feed.  [facility] and is aware to call and make an appointment with WIC at d/c. Mom had a Medela PIS at bedside for home use.  BF Resources Handout and LC Brochure given, mom informed of OP Services, BF Support Groups and LC Phone #. Enc mom to call for assistance as needed. Follow up tomorrow and prn.   Maternal Data Formula Feeding for Exclusion: No Has patient been taught Hand Expression?: Yes Does the patient have breastfeeding experience prior to this delivery?: No  Feeding Feeding Type: Breast Fed Length of feed: 40 min  LATCH Score/Interventions Latch: Repeated attempts needed to sustain latch, nipple held in mouth throughout feeding, stimulation needed to elicit sucking reflex. Intervention(s): Assist with latch;Breast compression;Adjust position  Audible Swallowing: Spontaneous and intermittent  Type of Nipple: Everted at rest and after stimulation  Comfort (Breast/Nipple): Soft / non-tender     Hold (Positioning):  Assistance needed to correctly position infant at breast and maintain latch. Intervention(s): Breastfeeding basics reviewed;Support Pillows;Position options;Skin to skin  LATCH Score: 8  Lactation Tools Discussed/Used WIC Program: Yes   Consult Status Consult Status: Follow-up Date: 12/18/15 Follow-up type: In-patient    Silas FloodSharon S Vernia Teem 12/17/2015, 4:43 PM

## 2015-12-17 NOTE — Op Note (Signed)
[name]:  [name], [name]              ACCOUNT NO.:  [id number]  MEDICAL RECORD NO.:  [id number]  LOCATION:  9103                          FACILITY:  [facility]  PHYSICIAN:  Pieter PartridgeEvelyn B Kiylah Loyer, MD   DATE OF BIRTH:  [birth year]  DATE OF PROCEDURE:  12/17/2015 DATE OF DISCHARGE:                              OPERATIVE REPORT   PREOPERATIVE DIAGNOSIS:  Intrauterine pregnancy at 40-3/7th weeks with nonreassuring fetal tracing.  POSTOPERATIVE DIAGNOSIS:  Intrauterine pregnancy at 40-3/7th weeks with nonreassuring fetal tracing.  Nuchal cord x1 and ROT presentation.  PROCEDURE:  Primary low-transverse cesarean section with 2-layer closure.  SURGEON:  Pieter PartridgeEvelyn B Gurpreet Mariani, MD  ASSISTANT:  Sherre ScarletKimberly Williams, certified nurse midwife.  ANESTHESIA:  Epidural.  BLOOD:  700.  URINE:  1875.  BLOOD ADMINISTERED:  None.  DRAINS:  Foley catheter.  LOCAL:  None.  SPECIMEN:  Placenta.  DISPOSITION OF SPECIMEN:  To Pathology.  DISPOSITION:  The patient was transferred to PACU in stable condition.  COMPLICATIONS:  None.  FINDINGS:  Viable female infant in the vertex position.  Right occiput transverse.  Nuchal cord x1.  Clear fluid noted.  Normal uterus, fallopian tubes, and ovaries.  INDICATIONS:  [name] [name] is a 20 year old, gravida 1, at 40-3/7th weeks, who presented to the hospital with complaint of contractions.  At that time, it was noted that she was having variable decelerations.  A BPP was then ordered, which showed normal fluid, but was 4 out of 10. The patient was offered a primary C-section versus trial of labor, the patient opted for trial of labor.  She was admitted and was not giving any augmentation of labor.  She was induced with a Foley bulb, which came out less than 2 hours, after it was placed, the patient was 4 cm. Eventually, IUPC and FSE were placed due to fetal tracing and to monitor, the patient is contracting.  She was never consistently adequate, but she was  progressing.  However, the patient began to have severe variable decelerations and she was contracting every 1-2 minutes on her own on cervical exam.  The patient was not complete and was remote from delivery.  Amnioinfusion, IV fluid bolus, O2 sats and position changes were tried without success.  The patient was then counseled on proceeding with primary low-transverse cesarean section and she consented to.  PROCEDURE IN DETAIL:  The patient was identified in the Labor and Delivery room 174.  She was then taken to the C-section OR and then epidural anesthesia was opted by Anesthesia.  She was prepped and draped in normal sterile fashion.  A time-out was performed at the beginning of the procedure, it was noted that she had gross hematuria, which had been present for an hour and half prior to surgery.  It would eventually clear after delivery of the baby.  After adequate anesthesia was confirmed, the procedure was started.  Abdomen was marked for Pfannenstiel skin incision and the incision was made with the scalpel, carried down to the underlying layer of the fascia with the Bovie.  Fascia was exposed and incised at the midline. Incision was extended with the curved Mayo scissors.  Fascia was then dissected off the rectus muscles  with the Kocher clamps and the Metzenbaum scissors.  Rectus muscles were separated at the midline with a hemostat and the peritoneum was identified, tented up with hemostats and entered sharply with the Metzenbaum scissors.  The incision was then stretched.  Intraabdominal palpation was performed.  Then, the Alexis retractor was placed.  Due to the gross hematuria, I expected to see the patient's bladder in the surgical field.  However, it was well displaced below.  Bladder flap was developed, incising the serosa with the Metzenbaums and Russians.  A low-transverse incision was then made with the scalpel, carried down to the amnion and extended with the  bandage scissors.  The head was then easily delivered to the incision.  Nose and mouth were suctioned. Nuchal cord was manually reduced and shoulders were delivered.  The baby was starting to cry on the field, as the baby moved to the bedside warmer, she had allow spontaneous cry and tone was great.  Cord blood was obtained.  The placenta was then removed manually.  The uterus was cleared of all clots and debris.  She did have some large vessels pumping on the lower portion of the incision that were clamped with the ring forceps and bleeding resolved.  She was a little boggy, 40 units of Pitocin was put under pressure and fundal massage was performed, which helped with the atony.  The incision was then closed with 0 Vicryl in a continuous locked fashion.  Second layer of the same suture was used for imbrication bleeding, it was hemostatic.  The patient had a few clots on the sidewall in the lower suprapubic area.  Those were all removed and copious irrigation was performed.  Fluid was clear.  Tubes and ovaries were identified and inspected, and they were normal.  Interceed was applied to the lower uterine segment.  The peritoneum was reapproximated with 2-0 Vicryl in a continuous running fashion.  The fascia was then closed with 0 Vicryl.  Subcutaneous space was closed with 2-0 plain-gut.  Skin was reapproximated with 4-0 Monocryl. Irrigation and Bovie were used for hemostasis with closure of every layer.  A pressure dressing was to be applied after Steri-Strips and benzoin were placed on the incision.  All instruments, sponge and needle counts were correct x3.  The patient received Ancef 2 g IV prior to the procedure.  She had SCDs on throughout the entire case.     Pieter Partridge, MD     EBV/MEDQ  D:  12/17/2015  T:  12/17/2015  Job:  [id number]

## 2015-12-17 NOTE — Progress Notes (Addendum)
Family at bedside.  Subjective: Strip reviewed - severe variables despite position change, IVFs and 02.   Objective: BP 136/64 mmHg  Pulse 93  Temp(Src) 98.5 F (36.9 C) (Oral)  Resp 18  Ht 5\' 5"  (1.651 m)  Wt 115.667 kg (255 lb)  BMI 42.43 kg/m2  SpO2 100%  LMP 03/01/2015   Total I/O In: -  Out: 1600 [Urine:1600]  FHT: Category 2 UC:   irregular, every 1-3 minutes SVE:   Dilation: 6 Effacement (%): 90 Station: -1 Exam by:: Leta JunglingEmilie Siska, RN   Assessment:  Non reassuring FHRT  Plan: Consulted Dr. Dion BodyVarnado regarding concerning tracing. C-section recommended to pt who concurs. Risks, Benefits, Alternatives including but not limited to bleeding, infection and injury were discussed with the patient. Patient verbalized understanding. Terbutaline 0.25 mg SQ x 1 now. Ancef 2 grams on call to OR.  Sherre [name], [name] Tasso CNM 12/17/2015, 3:23 AM

## 2015-12-17 NOTE — Anesthesia Postprocedure Evaluation (Signed)
Anesthesia Post Note  Patient: [name] [name]  Procedure(s) Performed: Procedure(s) (LRB): CESAREAN SECTION (N/A)  Patient location during evaluation: Mother Baby Anesthesia Type: Epidural Level of consciousness: awake, awake and alert, oriented and patient cooperative Pain management: pain level controlled Vital Signs Assessment: post-procedure vital signs reviewed and stable Respiratory status: spontaneous breathing, nonlabored ventilation and respiratory function stable Cardiovascular status: stable Postop Assessment: no headache, no backache, patient able to bend at knees and no signs of nausea or vomiting Anesthetic complications: no     Last Vitals:  Filed Vitals:   12/17/15 0919 12/17/15 1020  BP: 106/46 118/52  Pulse: 76 72  Temp: 36.9 C 36.5 C  Resp: 20 22    Last Pain:  Filed Vitals:   12/17/15 1116  PainSc: 0-No pain   Pain Goal: Patients Stated Pain Goal: 4 (12/16/15 2103)               Uzoma Vivona L

## 2015-12-17 NOTE — Progress Notes (Addendum)
IUPC out w/ position change. Unable to pick up ctxs w/ external.  Subjective: Comfortable w/ epidural.   Objective: BP 136/54 mmHg  Pulse 73  Temp(Src) 98.3 F (36.8 C) (Oral)  Resp 18  Ht 5\' 5"  (1.651 m)  Wt 115.667 kg (255 lb)  BMI 42.43 kg/m2  SpO2 99%  LMP 03/01/2015   Total I/O In: -  Out: 850 [Urine:850]  Today's Vitals   12/16/15 2340 12/16/15 2345 12/16/15 2350 12/17/15 0020  BP:    136/54  Pulse: 86 80 81 73  Temp:    98.3 F (36.8 C)  TempSrc:    Oral  Resp:    18  Height:      Weight:      SpO2: 99% 99% 99%   PainSc:       Results for orders placed or performed during the hospital encounter of 12/16/15 (from the past 24 hour(s))  [facility]     Status: None   Collection Time: 12/16/15  2:16 PM  Result Value Ref Range   Amnisure ROM NEGATIVE   CBC     Status: None   Collection Time: 12/16/15  5:05 PM  Result Value Ref Range   WBC 9.9 4.0 - 10.5 K/uL   RBC 4.29 3.87 - 5.11 MIL/uL   Hemoglobin 13.2 12.0 - 15.0 g/dL   HCT 95.639.3 21.336.0 - 08.646.0 %   MCV 91.6 78.0 - 100.0 fL   MCH 30.8 26.0 - 34.0 pg   MCHC 33.6 30.0 - 36.0 g/dL   RDW 57.814.3 46.911.5 - 62.915.5 %   Platelets 229 150 - 400 K/uL  Type and screen [facility]     Status: None   Collection Time: 12/16/15  5:05 PM  Result Value Ref Range   ABO/RH(D) O POS    Antibody Screen NEG    Sample Expiration 12/19/2015   Protein / creatinine ratio, urine     Status: None   Collection Time: 12/16/15 10:50 PM  Result Value Ref Range   Creatinine, Urine 28.00 mg/dL   Total Protein, Urine <6 mg/dL   Protein Creatinine Ratio        0.00 - 0.15 mg/mg[Cre]  Uric acid     Status: None   Collection Time: 12/16/15 10:55 PM  Result Value Ref Range   Uric Acid, Serum 3.7 2.3 - 6.6 mg/dL  CBC     Status: Abnormal   Collection Time: 12/16/15 10:55 PM  Result Value Ref Range   WBC 10.1 4.0 - 10.5 K/uL   RBC 3.78 (L) 3.87 - 5.11 MIL/uL   Hemoglobin 11.4 (L) 12.0 - 15.0  g/dL   HCT 52.834.7 (L) 41.336.0 - 24.446.0 %   MCV 91.8 78.0 - 100.0 fL   MCH 30.2 26.0 - 34.0 pg   MCHC 32.9 30.0 - 36.0 g/dL   RDW 01.014.1 27.211.5 - 53.615.5 %   Platelets 206 150 - 400 K/uL  Comprehensive metabolic panel     Status: Abnormal   Collection Time: 12/16/15 10:55 PM  Result Value Ref Range   Sodium 138 135 - 145 mmol/L   Potassium 3.8 3.5 - 5.1 mmol/L   Chloride 105 101 - 111 mmol/L   CO2 24 22 - 32 mmol/L   Glucose, Bld 84 65 - 99 mg/dL   BUN <5 (L) 6 - 20 mg/dL   Creatinine, Ser 6.440.52 0.44 - 1.00 mg/dL   Calcium 9.1 8.9 - 03.410.3 mg/dL   Total  Protein 6.6 6.5 - 8.1 g/dL   Albumin 3.1 (L) 3.5 - 5.0 g/dL   AST 19 15 - 41 U/L   ALT 15 14 - 54 U/L   Alkaline Phosphatase 135 (H) 38 - 126 U/L   Total Bilirubin 0.7 0.3 - 1.2 mg/dL   GFR calc non Af Amer >60 >60 mL/min   GFR calc Af Amer >60 >60 mL/min   Anion gap 9 5 - 15  Lactate dehydrogenase     Status: None   Collection Time: 12/16/15 10:55 PM  Result Value Ref Range   LDH 134 98 - 192 U/L    FHT: BL 120 w/ moderate variability, +accels, +earlys, +mild variables, +scalp stim. Late decel to nadir 70 bpm at 0054 x 1 min during IUPC replacement - recovered w/ position change UC:   irregular, every 2-3 minutes, MVUs 155-190 SVE: Unchanged    IUPC replaced  Preliminary BPP at 2330 = 8/8  Assessment:  Overall reassuring FHRT Normal labs/PCR BPP 8/8  Plan: Continue intrauterine resuscitative measures prn Attempt low dose Pitocin augmentation after Cat 1 FHRT obtained. R/B explained to pt in detail -- concurs w/ plan CTO closely Consult as indicated  Sherre Scarlet CNM 12/17/2015, 12:58 AM  Dr. Dion Body updated at 0200

## 2015-12-17 NOTE — Progress Notes (Signed)
Patient ID: [name] [name], female   DOB: [birth year], 20 y.o.   MRN: [id number]  Informed by CNM of severe variable decelerations, contractions 1-2 min and no cervical change.  S/p IVF bolus, amnioinfusion, O2 and position changes.  Pt is now s/p Terbutaline.  Moderate variables q 3-4 min, +variability.  Contractions not be traced.  Pt, FOB and mother of pt counseled on R/B/A cesarean section.  Pt and family allowed to ask questions.  Consents signed.

## 2015-12-17 NOTE — Transfer of Care (Signed)
Immediate Anesthesia Transfer of Care Note  Patient: [name] [name]  Procedure(s) Performed: Procedure(s): CESAREAN SECTION (N/A)  Patient Location: PACU  Anesthesia Type:Epidural  Level of Consciousness: awake, alert  and oriented  Airway & Oxygen Therapy: Patient Spontanous Breathing  Post-op Assessment: Report given to RN and Post -op Vital signs reviewed and stable  Post vital signs: Reviewed and stable  Last Vitals:  Filed Vitals:   12/17/15 0349 12/17/15 0354  BP:    Pulse: 106 108  Temp:    Resp:      Last Pain:  Filed Vitals:   12/17/15 0502  PainSc: 0-No pain      Patients Stated Pain Goal: 4 (12/16/15 2103)  Complications: No apparent anesthesia complications

## 2015-12-18 LAB — CBC
HEMATOCRIT: 26.8 % — AB (ref 36.0–46.0)
Hemoglobin: 9.1 g/dL — ABNORMAL LOW (ref 12.0–15.0)
MCH: 31.5 pg (ref 26.0–34.0)
MCHC: 34 g/dL (ref 30.0–36.0)
MCV: 92.7 fL (ref 78.0–100.0)
PLATELETS: 176 10*3/uL (ref 150–400)
RBC: 2.89 MIL/uL — ABNORMAL LOW (ref 3.87–5.11)
RDW: 14.4 % (ref 11.5–15.5)
WBC: 15.2 10*3/uL — ABNORMAL HIGH (ref 4.0–10.5)

## 2015-12-18 NOTE — Progress Notes (Signed)
Subjective: Postpartum Day 1: Cesarean Delivery Patient reports tolerating PO, + flatus and no problems voiding.  Denies HA, visual changes or abdominal pain.  Objective: Vital signs in last 24 hours: Temp:  [97.9 F (36.6 C)-98.5 F (36.9 C)] 98.2 F (36.8 C) (05/24 0622) Pulse Rate:  [66-83] 83 (05/24 0622) Resp:  [18-20] 18 (05/24 0622) BP: (110-137)/(52-62) 122/54 mmHg (05/24 0622) SpO2:  [100 %] 100 % (05/24 0250)  Physical Exam:  General: alert and no distress Lochia: appropriate Uterine Fundus: firm Incision: dressing c/d/i DVT Evaluation: No evidence of DVT seen on physical exam.   Recent Labs  12/16/15 2255 12/18/15 0514  HGB 11.4* 9.1*  HCT 34.7* 26.8*    Assessment/Plan: Status post Cesarean section. Doing well postoperatively.  Continue current care Iron daily Cont to observe BPs, currently asymptomatic  Mindi Akerson Y 12/18/2015, 10:57 AM

## 2015-12-18 NOTE — Lactation Note (Addendum)
This note was copied from a baby's chart. Lactation Consultation Note  Patient Name: [name] [name]'UToday's Date: 12/18/2015 Reason for consult: Follow-up assessment   Follow up with mom of 34 hour old infant. Infant with 3 BF for 25-90 minutes, 1 attempt, 1 bottle of formula of 18 cc, 2 voids and 2 stools in last 24 hours prior to this assessment. LATCH Scores 7-8 by bedside RN.   Infant has not fed since 9 am per mom. Mom was holding her and she was crying, mom reports she has not acted hungry today. Mom reports they are going on a walk, advised mom that infant is cueing to feed and offered to help her get infant latched. Assisted mom in latching infant to left breast in football hold, infant did not latch and was pulling on and off. Moved infant to cross cradle hold and assisted mom to latch to left breast. Infant took several tries to get latched. Infant did latch and was noted to have flanged lips and frequent intermittent swallows. Infant did need some stimulation to maintain sucking at breast. Mom did well once infant was latched. She reports she has difficulty latching infant independently, enc mom to call out to desk for latch assistance as needed.  Enc mom to place infant STS between feeds and offer the breast if infant shows feeding cues. Feeding cues reviewed with mom. Mom with easily expressible large gtts of colostrum. Mom is able to hand express. Enc her to hand express and spoon feed infant is she will not latch and then offer the breast.   Mom asking about a pump. Set up DEBP with instructions for set up, disassembling, assembling, cleaning, pumping, and milk storage. Enc mom to feed infant 8-12 x in 24 hours at first feeding cues. Pumping should be after BF and all pumped colostrum should be given to back to infant via spoon.   Discussed findings and plan with Lavell LusterB. Daly, RN. Will follow up tomorrow.   Maternal Data Formula Feeding for Exclusion: No Has patient been taught Hand  Expression?: Yes Does the patient have breastfeeding experience prior to this delivery?: No  Feeding Feeding Type: Breast Fed Length of feed: 25 min (Still feeding when I left the room)  LATCH Score/Interventions Latch: Grasps breast easily, tongue down, lips flanged, rhythmical sucking. Intervention(s): Skin to skin;Teach feeding cues;Waking techniques Intervention(s): Adjust position;Assist with latch;Breast massage;Breast compression  Audible Swallowing: Spontaneous and intermittent Intervention(s): Hand expression;Alternate breast massage  Type of Nipple: Flat Intervention(s): Shells  Comfort (Breast/Nipple): Soft / non-tender     Hold (Positioning): Assistance needed to correctly position infant at breast and maintain latch. Intervention(s): Breastfeeding basics reviewed;Support Pillows;Position options;Skin to skin  LATCH Score: 8  Lactation Tools Discussed/Used Pump Review: Setup, frequency, and cleaning;Milk Storage Initiated by:: Noralee StainSharon Adama Ferber, RN, IBCLC Date initiated:: 12/18/15   Consult Status Consult Status: Follow-up Date: 12/19/15 Follow-up type: In-patient    Silas FloodSharon S Amanat Hackel 12/18/2015, 3:52 PM

## 2015-12-19 NOTE — Progress Notes (Signed)
Subjective: Postpartum Day 2: Cesarean Delivery Patient reports that pain is well-managed.Lochia normal.  Ambulating, voiding, tolerating diet as ordered without difficulty. Normal flatus. Had 1 bowel movement.  Objective: Vital signs in last 24 hours: Temp:  [98.1 F (36.7 C)-98.3 F (36.8 C)] 98.3 F (36.8 C) (05/25 0500) Pulse Rate:  [83-93] 83 (05/25 0500) Resp:  [18] 18 (05/25 0500) BP: (113-116)/(57-78) 116/57 mmHg (05/25 0500)  Physical Exam:  General: alert Lochia: appropriate Uterine Fundus: firm and appropriately tender Incision: healing well DVT Evaluation: No evidence of DVT seen on physical exam. Edema 1+   Recent Labs  12/16/15 2255 12/18/15 0514  HGB 11.4* 9.1*  HCT 34.7* 26.8*    Assessment/Plan: Status post Cesarean section. Doing well postoperatively.  Continue current care. Anticipate discharge tomorrow  Laporscha Linehan A 12/19/2015, 7:17 AM

## 2015-12-19 NOTE — Lactation Note (Signed)
This note was copied from a baby's chart. Lactation Consultation Note  Patient Name: [name] [name] Reason for consult: Follow-up assessment Baby at 66 hr of life and mom's breast are filling. Baby is having a hard time maintaining latch because the areola has become tight. Applied #20 NS and after some repositioning baby settled down. Mom is able to express 30 ml. Given label and storage bottles. Reviewed milk handling guidelines and referred her to the Baby and Me book. Mom will bf on demand and pump as needed to keep breast soft. She will get off the NS as soon as she can. She is aware of lactation services and support group.   Maternal Data    Feeding Feeding Type: Breast Fed  LATCH Score/Interventions Latch: Repeated attempts needed to sustain latch, nipple held in mouth throughout feeding, stimulation needed to elicit sucking reflex. Intervention(s): Skin to skin Intervention(s): Adjust position;Assist with latch;Breast massage;Breast compression  Audible Swallowing: Spontaneous and intermittent Intervention(s): Hand expression;Skin to skin Intervention(s): Alternate breast massage  Type of Nipple: Everted at rest and after stimulation  Comfort (Breast/Nipple): Filling, red/small blisters or bruises, mild/mod discomfort  Problem noted: Filling Interventions (Filling): Massage;Double electric pump;Frequent nursing  Hold (Positioning): Assistance needed to correctly position infant at breast and maintain latch. Intervention(s): Position options;Support Pillows  LATCH Score: 7  Lactation Tools Discussed/Used Tools: Nipple Shields Nipple shield size: 20   Consult Status Consult Status: Follow-up Date: 12/20/15 Follow-up type: In-patient    [name] [name] [name], 10:46 PM

## 2015-12-19 NOTE — Lactation Note (Signed)
This note was copied from a baby's chart. Lactation Consultation Note  Patient Name: [name] [name] KGMWN'U Date: 12/19/2015 Reason for consult: Follow-up assessment  Mom's milk is coming to volume. Mom assisted w/latching "[place]" on L breast. Infant is nursing very well. Mom has been unable to latch infant on R breast, so she pumps when it is becoming uncomfortably full. Parents have been supplementing (finger-feeding) w/curved-tip syringe.   Specifics of an asymmetric latch shown via [employer]. Dad shown how to assist w/teacup hold.   Mom shown how to assemble & use hand pump that was included in pump kit.   RN asked to assist w/latching infant on R breast & to show how to use cup for supplementing, if asked.  Matthias Hughs [facility] 12/19/2015, 12:16 PM

## 2015-12-20 MED ORDER — IBUPROFEN 800 MG PO TABS: 800.0000 mg | ORAL_TABLET | Freq: Three times a day (TID) | ORAL | Status: DC | PRN

## 2015-12-20 MED ORDER — OXYCODONE-ACETAMINOPHEN 5-325 MG PO TABS: 1.0000 | ORAL_TABLET | ORAL | Status: DC | PRN

## 2015-12-20 MED ORDER — DOCUSATE SODIUM 100 MG PO CAPS: 100.0000 mg | ORAL_CAPSULE | Freq: Two times a day (BID) | ORAL | Status: DC

## 2015-12-20 NOTE — Discharge Summary (Signed)
[facility] Discharge Summary   Patient Name:   [name] [name] DOB:     [birth year] MRN:     [id number]  Date of Admission:   12/16/2015 Date of Discharge:  12/20/2015  Admitting diagnosis:    40WKS, CTX Principal Problem:   S/P primary low transverse C-section Active Problems:   Anemia Fetal indication for induction Biophysical profile 4 out of 8 Positive beta strep test    Discharge diagnosis:    Same  Fetal indication for cesarean delivery due to persistent deep variable decelerations  Nuchal cord                                                                 Post partum procedures: None  Type of Delivery:  Primary low transverse cesarean section  Delivering Provider: Geryl [name]   Date of Delivery:  12/17/2015  Newborn Data:    Live born female  Birth Weight: 6 lb 6.7 oz (2910 g) APGAR: 8, 9  [name] Baby Feeding:   Breast Disposition:   home with mother  Complications:   None  Hospital course:      Induction of Labor With Cesarean Section  20 y.o. yo G1P1001 At [redacted]w[redacted]d was admitted to the hospital 12/16/2015 for induction of labor. [name], [name] Membrane Rupture Time/Date: )10:30 AM ,12/16/2015   Details of operation can be found in separate operative Note.  The infant had a nuchal cord. Patient had an uncomplicated postpartum course. She is ambulating, tolerating a regular diet, passing flatus, and urinating well.  Patient is discharged home in stable condition on 12/20/2015.                                     Physical Exam:   Filed Vitals:   12/18/15 1700 12/19/15 0500 12/19/15 1852 12/20/15 0631  BP: 113/78 116/57 142/79 117/63  Pulse: 93 83 100 93  Temp:  98.1 F (36.7 C) 98.3 F (36.8 C) 98.1 F (36.7 C) 98.1 F (36.7 C)  TempSrc: Oral Oral Oral Oral  Resp: Height:      Weight:      SpO2:       General: alert and no distress Lochia: appropriate Uterine Fundus: firm Incision: Healing well with no significant drainage DVT Evaluation: No evidence of DVT seen on physical exam.  Labs: Lab Results  Component Value Date   WBC 15.2* 12/18/2015   HGB 9.1* 12/18/2015   HCT 26.8* 12/18/2015   MCV 92.7 12/18/2015   PLT 176 12/18/2015   CMP Latest Ref Rng 12/16/2015  Glucose 65 - 99 mg/dL 84  BUN 6 - 20 mg/dL <1(L)  Creatinine 2.44 - 1.00 mg/dL 0.10  Sodium 272 - 536 mmol/L 138  Potassium 3.5 - 5.1 mmol/L 3.8  Chloride 101 - 111 mmol/L 105  CO2 22 - 32 mmol/L 24  Calcium 8.9 - 10.3 mg/dL 9.1  Total Protein 6.5 - 8.1 g/dL 6.6  Total Bilirubin 0.3 - 1.2 mg/dL 0.7  Alkaline Phos 38 - 126 U/L 135(H)  AST 15 - 41 U/L 19  ALT 14 - 54 U/L 15    Discharge instruction: per After Visit Summary and "Baby and Me Booklet".  After Visit Meds:    Medication List    TAKE these medications        calcium carbonate 500 MG chewable tablet  Commonly known as:  TUMS - dosed in mg elemental calcium  Chew 2 tablets by mouth daily as needed for indigestion or heartburn.     docusate sodium 100 MG capsule  Commonly known as:  COLACE  Take 1 capsule (100 mg total) by mouth 2 (two) times daily.     ibuprofen 800 MG tablet  Commonly known as:  ADVIL,MOTRIN  Take 1 tablet (800 mg total) by mouth every 8 (eight) hours as needed.     ondansetron 4 MG disintegrating tablet  Commonly known as:  ZOFRAN-ODT  Take 4 mg by mouth every 6 (six) hours as needed for nausea or vomiting.     oxyCODONE-acetaminophen 5-325 MG tablet  Commonly known as:  ROXICET  Take 1 tablet by mouth every 4 (four) hours as needed for severe pain.     PRENATAL VITAMIN PO  Take 1 tablet by mouth daily.        Diet: routine diet  Activity: Advance as  tolerated. Pelvic rest for 6 weeks.   Outpatient follow up:6 weeks Follow up Appt[facility] [place] [facility]  Postpartum contraception: Undecided  12/20/2015 [name] [name],[name] Ellithorpe V, MD

## 2015-12-20 NOTE — Discharge Instructions (Signed)
Postpartum Depression and Baby Blues °The postpartum period begins right after the birth of a baby. During this time, there is often a great amount of joy and excitement. It is also a time of many changes in the life of the parents. Regardless of how many times a mother gives birth, each child brings new challenges and dynamics to the family. It is not unusual to have feelings of excitement along with confusing shifts in moods, emotions, and thoughts. All mothers are at risk of developing postpartum depression or the "baby blues." These mood changes can occur right after giving birth, or they may occur many months after giving birth. The baby blues or postpartum depression can be mild or severe. Additionally, postpartum depression can go away rather quickly, or it can be a long-term condition.  °CAUSES °Raised hormone levels and the rapid drop in those levels are thought to be a main cause of postpartum depression and the baby blues. A number of hormones change during and after pregnancy. Estrogen and progesterone usually decrease right after the delivery of your baby. The levels of thyroid hormone and various cortisol steroids also rapidly drop. Other factors that play a role in these mood changes include major life events and genetics.  °RISK FACTORS °If you have any of the following risks for the baby blues or postpartum depression, know what symptoms to watch out for during the postpartum period. Risk factors that may increase the likelihood of getting the baby blues or postpartum depression include: °· Having a personal or family history of depression.   °· Having depression while being pregnant.   °· Having premenstrual mood issues or mood issues related to oral contraceptives. °· Having a lot of life stress.   °· Having marital conflict.   °· Lacking a social support network.   °· Having a baby with special needs.   °· Having health problems, such as diabetes.   °SIGNS AND SYMPTOMS °Symptoms of baby blues  include: °· Brief changes in mood, such as going from extreme happiness to sadness. °· Decreased concentration.   °· Difficulty sleeping.   °· Crying spells, tearfulness.   °· Irritability.   °· Anxiety.   °Symptoms of postpartum depression typically begin within the first month after giving birth. These symptoms include: °· Difficulty sleeping or excessive sleepiness.   °· Marked weight loss.   °· Agitation.   °· Feelings of worthlessness.   °· Lack of interest in activity or food.   °Postpartum psychosis is a very serious condition and can be dangerous. Fortunately, it is rare. Displaying any of the following symptoms is cause for immediate medical attention. Symptoms of postpartum psychosis include:  °· Hallucinations and delusions.   °· Bizarre or disorganized behavior.   °· Confusion or disorientation.   °DIAGNOSIS  °A diagnosis is made by an evaluation of your symptoms. There are no medical or lab tests that lead to a diagnosis, but there are various questionnaires that a health care provider may use to identify those with the baby blues, postpartum depression, or psychosis. Often, a screening tool called the Edinburgh Postnatal Depression Scale is used to diagnose depression in the postpartum period.  °TREATMENT °The baby blues usually goes away on its own in 1-2 weeks. Social support is often all that is needed. You will be encouraged to get adequate sleep and rest. Occasionally, you may be given medicines to help you sleep.  °Postpartum depression requires treatment because it can last several months or longer if it is not treated. Treatment may include individual or group therapy, medicine, or both to address any social, physiological, and psychological   factors that may play a role in the depression. Regular exercise, a healthy diet, rest, and social support may also be strongly recommended.  °Postpartum psychosis is more serious and needs treatment right away. Hospitalization is often needed. °HOME CARE  INSTRUCTIONS °· Get as much rest as you can. Nap when the baby sleeps.   °· Exercise regularly. Some women find yoga and walking to be beneficial.   °· Eat a balanced and nourishing diet.   °· Do little things that you enjoy. Have a cup of tea, take a bubble bath, read your favorite magazine, or listen to your favorite music. °· Avoid alcohol.   °· Ask for help with household chores, cooking, grocery shopping, or running errands as needed. Do not try to do everything.   °· Talk to people close to you about how you are feeling. Get support from your partner, family members, friends, or other new moms. °· Try to stay positive in how you think. Think about the things you are grateful for.   °· Do not spend a lot of time alone.   °· Only take over-the-counter or prescription medicine as directed by your health care provider. °· Keep all your postpartum appointments.   °· Let your health care provider know if you have any concerns.   °SEEK MEDICAL CARE IF: °You are having a reaction to or problems with your medicine. °SEEK IMMEDIATE MEDICAL CARE IF: °· You have suicidal feelings.   °· You think you may harm the baby or someone else. °MAKE SURE YOU: °· Understand these instructions. °· Will watch your condition. °· Will get help right away if you are not doing well or get worse. °  °This information is not intended to replace advice given to you by your health care provider. Make sure you discuss any questions you have with your health care provider. °  °Document Released: 04/16/2004 Document Revised: 07/18/2013 Document Reviewed: 04/24/2013 °Elsevier Interactive Patient Education ©2016 [employer] °Cesarean Delivery, Care After °Refer to this sheet in the next few weeks. These instructions provide you with information on caring for yourself after your procedure. Your health care provider may also give you specific instructions. Your treatment has been planned according to current medical practices, but problems  sometimes occur. Call your health care provider if you have any problems or questions after you go home. °HOME CARE INSTRUCTIONS  °· Only take over-the-counter or prescription medications as directed by your health care provider. °· Do not drink alcohol, especially if you are breastfeeding or taking medication to relieve pain. °· Do not chew or smoke tobacco. °· Continue to use good perineal care. Good perineal care includes: °¨ Wiping your perineum from front to back. °¨ Keeping your perineum clean. °· Check your surgical cut (incision) daily for increased redness, drainage, swelling, or separation of skin. °· Clean your incision gently with soap and water every day, and then pat it dry. If your health care provider says it is okay, leave the incision uncovered. Use a bandage (dressing) if the incision is draining fluid or appears irritated. If the adhesive strips across the incision do not fall off within 7 days, carefully peel them off. °· Hug a pillow when coughing or sneezing until your incision is healed. This helps to relieve pain. °· Do not use tampons or douche until your health care provider says it is okay. °· Shower, wash your hair, and take tub baths as directed by your health care provider. °· Wear a well-fitting bra that provides breast support. °· Limit wearing support panties or   control-top hose. °· Drink enough fluids to keep your urine clear or pale yellow. °· Eat high-fiber foods such as whole grain cereals and breads, brown rice, beans, and fresh fruits and vegetables every day. These foods may help prevent or relieve constipation. °· Resume activities such as climbing stairs, driving, lifting, exercising, or traveling as directed by your health care provider. °· Talk to your health care provider about resuming sexual activities. This is dependent upon your risk of infection, your rate of healing, and your comfort and desire to resume sexual activity. °· Try to have someone help you with your  household activities and your newborn for at least a few days after you leave the hospital. °· Rest as much as possible. Try to rest or take a nap when your newborn is sleeping. °· Increase your activities gradually. °· Keep all of your scheduled postpartum appointments. It is very important to keep your scheduled follow-up appointments. At these appointments, your health care provider will be checking to make sure that you are healing physically and emotionally. °SEEK MEDICAL CARE IF:  °· You are passing large clots from your vagina. Save any clots to show your health care provider. °· You have a foul smelling discharge from your vagina. °· You have trouble urinating. °· You are urinating frequently. °· You have pain when you urinate. °· You have a change in your bowel movements. °· You have increasing redness, pain, or swelling near your incision. °· You have pus draining from your incision. °· Your incision is separating. °· You have painful, hard, or reddened breasts. °· You have a severe headache. °· You have blurred vision or see spots. °· You feel sad or depressed. °· You have thoughts of hurting yourself or your newborn. °· You have questions about your care, the care of your newborn, or medications. °· You are dizzy or light-headed. °· You have a rash. °· You have pain, redness, or swelling at the site of the removed intravenous access (IV) tube. °· You have nausea or vomiting. °· You stopped breastfeeding and have not had a menstrual period within 12 weeks of stopping. °· You are not breastfeeding and have not had a menstrual period within 12 weeks of delivery. °· You have a fever. °SEEK IMMEDIATE MEDICAL CARE IF: °· You have persistent pain. °· You have chest pain. °· You have shortness of breath. °· You faint. °· You have leg pain. °· You have stomach pain. °· Your vaginal bleeding saturates 2 or more sanitary pads in 1 hour. °MAKE SURE YOU:  °· Understand these instructions. °· Will watch your  condition. °· Will get help right away if you are not doing well or get worse. °  °This information is not intended to replace advice given to you by your health care provider. Make sure you discuss any questions you have with your health care provider. °  °Document Released: 04/04/2002 Document Revised: 08/03/2014 Document Reviewed: 03/09/2012 °Elsevier Interactive Patient Education ©2016 Elsevier Inc. ° °

## 2015-12-21 ENCOUNTER — Inpatient Hospital Stay (HOSPITAL_COMMUNITY): Admission: RE | Admit: 2015-12-21 | Payer: Medicaid Other | Source: Ambulatory Visit

## 2015-12-25 ENCOUNTER — Encounter (HOSPITAL_COMMUNITY): Payer: Self-pay | Admitting: *Deleted

## 2015-12-25 ENCOUNTER — Inpatient Hospital Stay (HOSPITAL_COMMUNITY)
Admission: AD | Admit: 2015-12-25 | Discharge: 2015-12-27 | DRG: 776 | Disposition: A | Discharge status: Home / Self Care | Payer: Medicaid Other | Source: Ambulatory Visit | Attending: Obstetrics and Gynecology | Admitting: Obstetrics and Gynecology

## 2015-12-25 DIAGNOSIS — O8612 Endometritis following delivery: Principal | ICD-10-CM | POA: Diagnosis present

## 2015-12-25 LAB — URINALYSIS, ROUTINE W REFLEX MICROSCOPIC
BILIRUBIN URINE: NEGATIVE
GLUCOSE, UA: NEGATIVE mg/dL
Ketones, ur: 15 mg/dL — AB
Nitrite: NEGATIVE
PROTEIN: NEGATIVE mg/dL
Specific Gravity, Urine: <1.005 — ABNORMAL LOW (ref 1.005–1.030)
pH: 5.5 (ref 5.0–8.0)

## 2015-12-25 LAB — URINALYSIS W MICROSCOPIC (NOT AT ARMC)
BILIRUBIN URINE: NEGATIVE
GLUCOSE, UA: NEGATIVE mg/dL
Ketones, ur: 40 mg/dL — AB
NITRITE: NEGATIVE
PH: 6 (ref 5.0–8.0)
Protein, ur: NEGATIVE mg/dL
SPECIFIC GRAVITY, URINE: 1.015 (ref 1.005–1.030)

## 2015-12-25 LAB — COMPREHENSIVE METABOLIC PANEL
ALT: 28 U/L (ref 14–54)
AST: 18 U/L (ref 15–41)
Albumin: 3.1 g/dL — ABNORMAL LOW (ref 3.5–5.0)
Alkaline Phosphatase: 95 U/L (ref 38–126)
Anion gap: 10 (ref 5–15)
BUN: 7 mg/dL (ref 6–20)
CHLORIDE: 107 mmol/L (ref 101–111)
CO2: 22 mmol/L (ref 22–32)
CREATININE: 0.79 mg/dL (ref 0.44–1.00)
Calcium: 8.6 mg/dL — ABNORMAL LOW (ref 8.9–10.3)
Glucose, Bld: 104 mg/dL — ABNORMAL HIGH (ref 65–99)
Potassium: 3.2 mmol/L — ABNORMAL LOW (ref 3.5–5.1)
Sodium: 139 mmol/L (ref 135–145)
Total Bilirubin: 0.9 mg/dL (ref 0.3–1.2)
Total Protein: 6.7 g/dL (ref 6.5–8.1)

## 2015-12-25 LAB — PROTEIN / CREATININE RATIO, URINE
Creatinine, Urine: 163 mg/dL
Protein Creatinine Ratio: 0.09 mg/mg{Cre} (ref 0.00–0.15)
Total Protein, Urine: 15 mg/dL

## 2015-12-25 LAB — URINE MICROSCOPIC-ADD ON

## 2015-12-25 LAB — CBC WITH DIFFERENTIAL/PLATELET
Basophils Absolute: 0 10*3/uL (ref 0.0–0.1)
Basophils Relative: 0 %
EOS PCT: 0 %
Eosinophils Absolute: 0 10*3/uL (ref 0.0–0.7)
HCT: 30.6 % — ABNORMAL LOW (ref 36.0–46.0)
Hemoglobin: 10.5 g/dL — ABNORMAL LOW (ref 12.0–15.0)
LYMPHS ABS: 1.8 10*3/uL (ref 0.7–4.0)
LYMPHS PCT: 8 %
MCH: 30.4 pg (ref 26.0–34.0)
MCHC: 34.3 g/dL (ref 30.0–36.0)
MCV: 88.7 fL (ref 78.0–100.0)
MONO ABS: 0.8 10*3/uL (ref 0.1–1.0)
MONOS PCT: 4 %
Neutro Abs: 18.5 10*3/uL — ABNORMAL HIGH (ref 1.7–7.7)
Neutrophils Relative %: 88 %
PLATELETS: 306 10*3/uL (ref 150–400)
RBC: 3.45 MIL/uL — AB (ref 3.87–5.11)
RDW: 13.5 % (ref 11.5–15.5)
WBC: 21.1 10*3/uL — ABNORMAL HIGH (ref 4.0–10.5)

## 2015-12-25 MED ORDER — ACETAMINOPHEN 325 MG PO TABS
650.0000 mg | ORAL_TABLET | Freq: Four times a day (QID) | ORAL | Status: DC | PRN
  Administered 2015-12-25 – 2015-12-27 (×4): 650 mg via ORAL
  Filled 2015-12-25 (×4): qty 2

## 2015-12-25 MED ORDER — SODIUM CHLORIDE 0.9 % IV SOLN
3.0000 g | Freq: Four times a day (QID) | INTRAVENOUS | Status: DC
  Administered 2015-12-25 – 2015-12-27 (×7): 3 g via INTRAVENOUS
  Filled 2015-12-25 (×9): qty 3

## 2015-12-25 MED ORDER — IBUPROFEN 600 MG PO TABS
600.0000 mg | ORAL_TABLET | Freq: Four times a day (QID) | ORAL | Status: DC | PRN
  Administered 2015-12-26: 600 mg via ORAL
  Filled 2015-12-25: qty 1

## 2015-12-25 MED ORDER — LACTATED RINGERS IV SOLN
INTRAVENOUS | Status: DC
  Administered 2015-12-25 – 2015-12-27 (×3): via INTRAVENOUS

## 2015-12-25 MED ORDER — HYDRALAZINE HCL 20 MG/ML IJ SOLN: 10.0000 mg | Freq: Once | INTRAMUSCULAR | Status: DC | PRN

## 2015-12-25 MED ORDER — LABETALOL HCL 5 MG/ML IV SOLN: 20.0000 mg | INTRAVENOUS | Status: DC | PRN

## 2015-12-25 NOTE — H&P (Signed)
[name] [name] is an 20 y.o. female. S/p LTCS on 12/17/15 presents with complaints of fever of 102 F and headache unrelieved by 600 mg ibuprofen taken at 1700.   Pertinent Gynecological History: Bleeding: appropriate for postpartum period Blood transfusions: none Sexually transmitted diseases: no past history Previous GYN Procedures: none ;  LTCS   Incision: Clean, dry, intact and healing Last pap:  N/A OB History: G1, P1001   Menstrual History:  Patient's last menstrual period was 03/01/2015.    Past Medical History  Diagnosis Date  . Medical history non-contributory     Past Surgical History  Procedure Laterality Date  . Cesarean section N/A 12/17/2015    Procedure: CESAREAN SECTION;  Surgeon: Geryl RankinsEvelyn Varnado, MD;  [facility];  Service: Obstetrics;  Laterality: N/A;    History reviewed. No pertinent family history.  Social History:  reports that she has never smoked. She has never used smokeless tobacco. She reports that she does not drink alcohol or use illicit drugs.  Allergies: No Known Allergies  Prescriptions prior to admission  Medication Sig Dispense Refill Last Dose  . calcium carbonate (TUMS - DOSED IN MG ELEMENTAL CALCIUM) 500 MG chewable tablet Chew 2 tablets by mouth daily as needed for indigestion or heartburn.   Past Month at Unknown time  . ibuprofen (ADVIL,MOTRIN) 800 MG tablet Take 1 tablet (800 mg total) by mouth every 8 (eight) hours as needed. 50 tablet 1 12/25/2015 at Unknown time  . oxyCODONE-acetaminophen (ROXICET) 5-325 MG tablet Take 1 tablet by mouth every 4 (four) hours as needed for severe pain. 10 tablet 0 Past Week at Unknown time  . Prenatal Vit-Fe Fumarate-FA (PRENATAL MULTIVITAMIN) TABS tablet Take 1 tablet by mouth daily at 12 noon.   Past Month at Unknown time  . simethicone (MYLICON) 80 MG chewable tablet Chew 80 mg by mouth 2 (two) times daily as needed (for gas.).   12/24/2015 at Unknown time  . docusate sodium (COLACE) 100  MG capsule Take 1 capsule (100 mg total) by mouth 2 (two) times daily. 60 capsule 2 Has not started    Review of Systems  Constitutional: Positive for fever and chills.  Eyes: Negative.   Respiratory: Negative.   Cardiovascular: Negative.   Gastrointestinal: Negative.   Genitourinary: Negative.   Musculoskeletal: Negative.   Neurological: Positive for headaches.  Endo/Heme/Allergies: Negative.   Psychiatric/Behavioral: Negative.     Blood pressure 124/94, pulse 99, temperature 102.1 F (38.9 C), temperature source Oral, resp. rate 20, last menstrual period 03/01/2015, SpO2 98 %, currently breastfeeding. Physical Exam  Constitutional: She is oriented to person, place, and time. She appears well-developed.  HENT:  Head: Normocephalic.  Eyes: Conjunctivae are normal. Pupils are equal, round, and reactive to light.  Neck: Normal range of motion. Neck supple.  Cardiovascular: Normal rate and regular rhythm.   Respiratory: Effort normal and breath sounds normal.  GI: Soft.  Musculoskeletal: Normal range of motion.  Neurological: She is alert and oriented to person, place, and time. She has normal reflexes.  Skin: Skin is warm and dry.  Psychiatric: She has a normal mood and affect. Her behavior is normal.    Results for orders placed or performed during the hospital encounter of 12/25/15 (from the past 24 hour(s))  Urinalysis, Routine w reflex microscopic (not at [facility])     Status: Abnormal   Collection Time: 12/25/15  7:40 PM  Result Value Ref Range   Color, Urine YELLOW YELLOW   APPearance CLEAR CLEAR  Specific Gravity, Urine <1.005 (L) 1.005 - 1.030   pH 5.5 5.0 - 8.0   Glucose, UA NEGATIVE NEGATIVE mg/dL   Hgb urine dipstick LARGE (A) NEGATIVE   Bilirubin Urine NEGATIVE NEGATIVE   Ketones, ur 15 (A) NEGATIVE mg/dL   Protein, ur NEGATIVE NEGATIVE mg/dL   Nitrite NEGATIVE NEGATIVE   Leukocytes, UA MODERATE (A) NEGATIVE  Urine microscopic-add on     Status: Abnormal    Collection Time: 12/25/15  7:40 PM  Result Value Ref Range   Squamous Epithelial / LPF 0-5 (A) NONE SEEN   WBC, UA 6-30 0 - 5 WBC/hpf   RBC / HPF 6-30 0 - 5 RBC/hpf   Bacteria, UA FEW (A) NONE SEEN  CBC with Differential/Platelet     Status: Abnormal   Collection Time: 12/25/15 10:08 PM  Result Value Ref Range   WBC 21.1 (H) 4.0 - 10.5 K/uL   RBC 3.45 (L) 3.87 - 5.11 MIL/uL   Hemoglobin 10.5 (L) 12.0 - 15.0 g/dL   HCT 16.1 (L) 09.6 - 04.5 %   MCV 88.7 78.0 - 100.0 fL   MCH 30.4 26.0 - 34.0 pg   MCHC 34.3 30.0 - 36.0 g/dL   RDW 40.9 81.1 - 91.4 %   Platelets 306 150 - 400 K/uL   Neutrophils Relative % 88 %   Neutro Abs 18.5 (H) 1.7 - 7.7 K/uL   Lymphocytes Relative 8 %   Lymphs Abs 1.8 0.7 - 4.0 K/uL   Monocytes Relative 4 %   Monocytes Absolute 0.8 0.1 - 1.0 K/uL   Eosinophils Relative 0 %   Eosinophils Absolute 0.0 0.0 - 0.7 K/uL   Basophils Relative 0 %   Basophils Absolute 0.0 0.0 - 0.1 K/uL  Comprehensive metabolic panel     Status: Abnormal   Collection Time: 12/25/15 10:08 PM  Result Value Ref Range   Sodium 139 135 - 145 mmol/L   Potassium 3.2 (L) 3.5 - 5.1 mmol/L   Chloride 107 101 - 111 mmol/L   CO2 22 22 - 32 mmol/L   Glucose, Bld 104 (H) 65 - 99 mg/dL   BUN 7 6 - 20 mg/dL   Creatinine, Ser 7.82 0.44 - 1.00 mg/dL   Calcium 8.6 (L) 8.9 - 10.3 mg/dL   Total Protein 6.7 6.5 - 8.1 g/dL   Albumin 3.1 (L) 3.5 - 5.0 g/dL   AST 18 15 - 41 U/L   ALT 28 14 - 54 U/L   Alkaline Phosphatase 95 38 - 126 U/L   Total Bilirubin 0.9 0.3 - 1.2 mg/dL   GFR calc non Af Amer >60 >60 mL/min   GFR calc Af Amer >60 >60 mL/min   Anion gap 10 5 - 15  Urinalysis with microscopic (not at [facility])     Status: Abnormal   Collection Time: 12/25/15 10:52 PM  Result Value Ref Range   Color, Urine YELLOW YELLOW   APPearance CLEAR CLEAR   Specific Gravity, Urine 1.015 1.005 - 1.030   pH 6.0 5.0 - 8.0   Glucose, UA NEGATIVE NEGATIVE mg/dL   Hgb urine dipstick LARGE (A) NEGATIVE   Bilirubin  Urine NEGATIVE NEGATIVE   Ketones, ur 40 (A) NEGATIVE mg/dL   Protein, ur NEGATIVE NEGATIVE mg/dL   Nitrite NEGATIVE NEGATIVE   Leukocytes, UA TRACE (A) NEGATIVE   WBC, UA 0-5 0 - 5 WBC/hpf   RBC / HPF 6-30 0 - 5 RBC/hpf   Bacteria, UA FEW (A) NONE SEEN   Squamous  Epithelial / LPF 0-5 (A) NONE SEEN  Protein / creatinine ratio, urine     Status: None   Collection Time: 12/25/15 10:52 PM  Result Value Ref Range   Creatinine, Urine 163.00 mg/dL   Total Protein, Urine 15 mg/dL   Protein Creatinine Ratio 0.09 0.00 - 0.15 mg/mg[Cre]    No results found.  Assessment/Plan: Suspected Postpartum endometritis Admit for Postpartum IV Antibiotics/Observation per Dr. Normand Sloop Unasyn 3 g Blood and urine cultures pending Motrin/Tylenol for antipyretic and pain management IV Fluids, LR 125 ml/hr   Alphonzo Severance 12/25/2015, 9:27 PM

## 2015-12-25 NOTE — MAU Note (Addendum)
Pt states she had a Primary C/S on 12/17/15.  Started having headache yesterday, hot/cold chills, and SOB. Has not taken her temp. Last took Ibuprofen at 1700.   Also C/O incisional pain. Pt is currently breastfeeding.

## 2015-12-26 DIAGNOSIS — O864 Pyrexia of unknown origin following delivery: Secondary | ICD-10-CM | POA: Diagnosis present

## 2015-12-26 DIAGNOSIS — O8612 Endometritis following delivery: Secondary | ICD-10-CM | POA: Diagnosis present

## 2015-12-26 LAB — BASIC METABOLIC PANEL
Anion gap: 6 (ref 5–15)
BUN: 8 mg/dL (ref 6–20)
CALCIUM: 8.4 mg/dL — AB (ref 8.9–10.3)
CO2: 24 mmol/L (ref 22–32)
Chloride: 111 mmol/L (ref 101–111)
Creatinine, Ser: 0.64 mg/dL (ref 0.44–1.00)
GFR calc Af Amer: >60 mL/min (ref >60)
GLUCOSE: 97 mg/dL (ref 65–99)
Potassium: 3.8 mmol/L (ref 3.5–5.1)
Sodium: 141 mmol/L (ref 135–145)

## 2015-12-26 LAB — CBC WITH DIFFERENTIAL/PLATELET
BASOS ABS: 0 10*3/uL (ref 0.0–0.1)
Basophils Relative: 0 %
EOS ABS: 0.2 10*3/uL (ref 0.0–0.7)
EOS PCT: 1 %
HCT: 29.1 % — ABNORMAL LOW (ref 36.0–46.0)
Hemoglobin: 9.8 g/dL — ABNORMAL LOW (ref 12.0–15.0)
LYMPHS ABS: 2.3 10*3/uL (ref 0.7–4.0)
LYMPHS PCT: 12 %
MCH: 30 pg (ref 26.0–34.0)
MCHC: 33.7 g/dL (ref 30.0–36.0)
MCV: 89 fL (ref 78.0–100.0)
MONO ABS: 1 10*3/uL (ref 0.1–1.0)
Monocytes Relative: 5 %
Neutro Abs: 15.5 10*3/uL — ABNORMAL HIGH (ref 1.7–7.7)
Neutrophils Relative %: 82 %
PLATELETS: 271 10*3/uL (ref 150–400)
RBC: 3.27 MIL/uL — ABNORMAL LOW (ref 3.87–5.11)
RDW: 13.5 % (ref 11.5–15.5)
WBC: 19.1 10*3/uL — ABNORMAL HIGH (ref 4.0–10.5)

## 2015-12-26 NOTE — Lactation Note (Signed)
Lactation Consultation Note  Visit made.  Mom readmitted with fever.  She states she is pumping and bottle feeding.  DEBP is set up in room.  Mom is pumping without problems and milk supply is abundant.  Reviewed breast milk storage.  Encouraged to call with concerns prn.  Patient Name: [name] UJWJX'BToday's Date: 12/26/2015     Maternal Data    Feeding    LATCH Score/Interventions                      Lactation Tools Discussed/Used     Consult Status      Huston FoleyMOULDEN, Virdell Hoiland S 12/26/2015, 11:17 AM

## 2015-12-26 NOTE — Progress Notes (Signed)
[name] [name], [name] [name]. DOB : [birth year]   MRN: [id number]  Subjective: Patient reports feeling better.  She denies any more fevers or chills.  She is tolerating regular diet, passing gas.  She denies nausea or vomiting. She denies chest pain or shortness of breath.  She is voiding without difficulty and denies dysuria, urgency or frequency.  Abdominal pain is well controlled.  She reports minimal vaginal bleeding.  She continues with headache that was not relieved with tylenol.   Objective: I have reviewed patient's vital signs, medications and labs. Filed Vitals:   12/26/15 0211 12/26/15 0527 12/26/15 0700 12/26/15 0918  BP: 117/83 113/64  133/75  Pulse: 93 92  80  Temp: 98.9 F (37.2 C) 98.5 F (36.9 C)  99.4 F (37.4 C)  TempSrc: Oral Oral  Oral  Resp: 16 16  18   Height:   5\' 5"  (1.651 m)   Weight:   115.667 kg (255 lb)   SpO2: 99% 99%  100%   Last fever 102 degress 5/31@1951 .   General: alert, cooperative and no distress Resp: clear to auscultation bilaterally Cardio: regular rate and rhythm, S1, S2 normal, no murmur, click, rub or gallop GI: soft, non-tender; bowel sounds normal; no masses,  no organomegaly Extremities: extremities normal, atraumatic, no cyanosis or edema and no edema, redness or tenderness in the calves or thighs Vaginal Bleeding: minimal Incision: Clean, dry and intact  CBC    Component Value Date/Time   WBC 19.1* 12/26/2015 0756   RBC 3.27* 12/26/2015 0756   HGB 9.8* 12/26/2015 0756   HCT 29.1* 12/26/2015 0756   PLT 271 12/26/2015 0756   MCV 89.0 12/26/2015 0756   MCH 30.0 12/26/2015 0756   MCHC 33.7 12/26/2015 0756   RDW 13.5 12/26/2015 0756   LYMPHSABS 2.3 12/26/2015 0756   MONOABS 1.0 12/26/2015 0756   EOSABS 0.2 12/26/2015 0756   BASOSABS 0.0 12/26/2015 0756   CMP     Component Value Date/Time   NA 141 12/26/2015 0756   K 3.8 12/26/2015 0756   CL 111 12/26/2015 0756   CO2 24 12/26/2015 0756   GLUCOSE 97 12/26/2015 0756   BUN 8 12/26/2015  0756   CREATININE 0.64 12/26/2015 0756   CALCIUM 8.4* 12/26/2015 0756   PROT 6.7 12/25/2015 2208   ALBUMIN 3.1* 12/25/2015 2208   AST 18 12/25/2015 2208   ALT 28 12/25/2015 2208   ALKPHOS 95 12/25/2015 2208   BILITOT 0.9 12/25/2015 2208   GFRNONAA >60 12/26/2015 0756   GFRAA >60 12/26/2015 0756    Current facility-administered medications:  .  acetaminophen (TYLENOL) tablet 650 mg, 650 mg, Oral, Q6H PRN, Alphonzo Severanceachel Stall, CNM, 650 mg at 12/25/15 2204 .  Ampicillin-Sulbactam (UNASYN) 3 g in sodium chloride 0.9 % 100 mL IVPB, 3 g, Intravenous, Q6H, Alphonzo Severanceachel Stall, CNM, 3 g at 12/26/15 0918 .  hydrALAZINE (APRESOLINE) injection 10 mg, 10 mg, Intravenous, Once PRN, Alphonzo Severanceachel Stall, CNM .  ibuprofen (ADVIL,MOTRIN) tablet 600 mg, 600 mg, Oral, Q6H PRN, Jaymes GraffNaima Dillard, MD, 600 mg at 12/26/15 0921 .  labetalol (NORMODYNE,TRANDATE) injection 20-80 mg, 20-80 mg, Intravenous, Q10 min PRN, Alphonzo Severanceachel Stall, CNM .  lactated ringers infusion, , Intravenous, Continuous, Alphonzo Severanceachel Stall, CNM, Last Rate: 125 mL/hr at 12/25/15 2304  Assessment/Plan: 20 y/o P1 POD # 8 after primary c/s for abnormal fetal heart tracing, readmitted yesterday for fevers, likely with post partum endometritis, improved and stable  -Continue with antibitoics Unasyn.   -Follow up on blood and urine cultures -Ibuprofen for headache -Out  of bed and ambulation.   [facility] 12/26/2015, 10:45 AM

## 2015-12-27 LAB — CBC WITH DIFFERENTIAL/PLATELET
BASOS ABS: 0 10*3/uL (ref 0.0–0.1)
Basophils Relative: 0 %
Eosinophils Absolute: 0.4 10*3/uL (ref 0.0–0.7)
Eosinophils Relative: 3 %
HEMATOCRIT: 28.7 % — AB (ref 36.0–46.0)
Hemoglobin: 9.3 g/dL — ABNORMAL LOW (ref 12.0–15.0)
LYMPHS ABS: 2.8 10*3/uL (ref 0.7–4.0)
LYMPHS PCT: 22 %
MCH: 29 pg (ref 26.0–34.0)
MCHC: 32.4 g/dL (ref 30.0–36.0)
MCV: 89.4 fL (ref 78.0–100.0)
MONO ABS: 0.5 10*3/uL (ref 0.1–1.0)
MONOS PCT: 4 %
NEUTROS ABS: 9 10*3/uL — AB (ref 1.7–7.7)
Neutrophils Relative %: 71 %
PLATELETS: 290 10*3/uL (ref 150–400)
RBC: 3.21 MIL/uL — AB (ref 3.87–5.11)
RDW: 13.6 % (ref 11.5–15.5)
WBC: 12.8 10*3/uL — ABNORMAL HIGH (ref 4.0–10.5)

## 2015-12-27 LAB — CULTURE, OB URINE: Culture: <10000 — AB

## 2015-12-27 MED ORDER — AMOXICILLIN-POT CLAVULANATE 875-125 MG PO TABS: 1.0000 | ORAL_TABLET | Freq: Two times a day (BID) | ORAL | Status: AC

## 2015-12-27 NOTE — Progress Notes (Signed)
[name] [name] [id number]  Subjective: Postpartum Day #10: [facility] with readmit on 12/[facility]/15 due to presumptive endometritis Patient up ad lib, reports no syncope or dizziness.  Does have persistent frontal HA, no visual sx or RUQ pain. Feeding:  Pumping and feeding via bottle. Contraceptive plan:  Still undecided  Patient's father passed away recently, with [place].  Patient does not desire to go to the funeral.  Objective: Temp:  [98.3 F (36.8 C)-99.4 F (37.4 C)] 98.3 F (36.8 C) (06/02 0600) Pulse Rate:  [64-80] 68 (06/02 0600) Resp:  [17-20] 18 (06/02 0600) BP: (118-141)/(72-80) 125/75 mmHg (06/02 0600) SpO2:  [99 %-100 %] 99 % (06/02 0600)   Filed Vitals:   12/26/15 1738 12/26/15 2149 12/27/15 0200 12/27/15 0600  BP: 141/80 119/74 118/73 125/75  Pulse: 68 72 64 68  Temp: 98.6 F (37 C) 99 F (37.2 C) 98.9 F (37.2 C) 98.3 F (36.8 C)  TempSrc: Oral Oral Oral Oral  Resp: 18 20 17 18   Height:      Weight:      SpO2: 100% 100% 99% 99%    CBC Latest Ref Rng 12/27/2015 12/26/2015 12/25/2015  WBC 4.0 - 10.5 K/uL 12.8(H) 19.1(H) 21.1(H)  Hemoglobin 12.0 - 15.0 g/dL 4.0(J9.3(L) 8.1(X9.8(L) 10.5(L)  Hematocrit 36.0 - 46.0 % 28.7(L) 29.1(L) 30.6(L)  Platelets 150 - 400 K/uL 290 271 306   Blood cultures negative on preliminary result. Urine culture pending.  Last temp 12/25/15 at 1951  . ampicillin-sulbactam (UNASYN) IV  3 g Intravenous Q6H  Has had 6 doses since readmit.  Received Tylenol 650 mg at 0048 with benefit for HA, Ibuprophen 600 mg at 0921 yesterday (reports doesn't help HA)  Physical Exam:  General: alert Lochia: appropriate Uterine Fundus: firm Abdomen:  + bowel sounds Incision: Honeycomb dressing CDI, uterus approx 14 week size, NT. Lochia scant Breasts full, lactating. DVT Evaluation: No evidence of DVT seen on physical exam. Negative Homan's sign.   Assessment/Plan: Status post cesarean delivery, day #10--readmit due to  presumptive endometritis. Stable--afebrile x 36 hours. Continue current care. Will consult Dr. Normand Sloopillard regarding d/c planning--patient does not plan to attend father's funeral. Tylenol now for HA--negative PIH w/u on admission.   Nigel BridgemanLATHAM, [name] Rother MSN, CNM 12/27/2015, 7:19 AM

## 2015-12-27 NOTE — Discharge Instructions (Signed)
Endometritis °Endometritis is an irritation, soreness, and swelling (inflammation) of the lining of the uterus (endometrium).  °CAUSES  °· Bacterial infections. °· Sexually transmitted infections (STIs). °· Having a miscarriage or childbirth, especially after a long labor or cesarean delivery. °· Certain gynecological procedures (such as dilation and curettage, hysteroscopy, or contraceptive insertion). °SIGNS AND SYMPTOMS  °· Fever. °· Lower abdominal or pelvic pain. °· Abnormal vaginal discharge or bleeding. °· Abdominal bloating (distention) or swelling. °· General discomfort or ill feeling. °· Discomfort with bowel movements. °DIAGNOSIS  °A physical and pelvic exam are performed. Other tests may include: °· Cultures from the cervix. °· Blood tests. °· Examining a tissue sample of the uterine lining (endometrial biopsy). °· Examining discharge under a microscope (wet prep). °· Laparoscopy. °TREATMENT  °Antibiotic medicines are usually given. Other treatments may include: °· Fluids through an IV tube inserted in your vein. °· Rest. °HOME CARE INSTRUCTIONS  °· Take over-the-counter or prescription medicines for pain, discomfort, or fever as directed by your health care provider. °· Take your antibiotics as directed. Finish them even if you start to feel better. °· Resume your normal diet and activities as directed or as tolerated. °· Do not douche or have sexual intercourse until your health care provider says it is okay. °· Do not have sexual intercourse until your partner has been treated if your endometritis is caused by an STI. °SEEK IMMEDIATE MEDICAL CARE IF:  °· You have swelling or increasing pain in the abdomen. °· You have a fever. °· You have bad smelling vaginal discharge, or you have an increased amount of discharge. °· You have abnormal vaginal bleeding. °· Your medicine is not helping with the pain. °· You experience any problems that may be related to the medicine you are taking. °· You have nausea  and vomiting, or you cannot keep foods down. °· You have pain with bowel movements. °MAKE SURE YOU:  °· Understand these instructions. °· Will watch your condition. °· Will get help right away if you are not doing well or get worse. °  °This information is not intended to replace advice given to you by your health care provider. Make sure you discuss any questions you have with your health care provider. °  °Document Released: 07/07/2001 Document Revised: 03/15/2013 Document Reviewed: 02/09/2013 °Elsevier Interactive Patient Education ©2016 Elsevier Inc. ° °

## 2015-12-27 NOTE — Progress Notes (Signed)
Assumed care from Lisa Scott, RN. 

## 2015-12-27 NOTE — Discharge Summary (Signed)
Physician Discharge Summary  Patient ID: [name] [name] MRN: [id number] DOB/AGE: 02-Nov-1995 20 y.o.  Admit date: 12/25/2015 Discharge date: 12/27/2015  Admission Diagnoses: S/p primary LTCS due to [facility] on 12/17/15 PP fever  Discharge Diagnoses:  Principal Problem:   Postpartum endometritis s/p primary LTCS on 12/17/15  Discharged Condition: stable  Hospital Course: Presented to MAU on 5/31/ with hx of fever o 102 and HA--s/p primary LTCS on 5/23 due to [facility], without complications.  GBS was positive during pregnancy.  Patient started on IV hydration and Unasyn course.  Afebrile since 12/25/15 at 1951, with initial WBC count of 21.1, then 19.1 on hospital day #1, and 12.8 on day 2. Her father had passed away recently, and the funeral was to be in [place] this afternoon.  Patient had indicated she was not planning to go the the service.  Dr. Charlesetta Garibaldi saw the patient and deemed her appropriate for d/c.  She was d/c'd home on Augmentin 875 mg po BID x 10 days, with plan for f/u visit at [facility] in 1 week.  Precautions and instructions were reviewed with the patient.  Urine and blood cultures were resulted as negative prior to d/c.   She was pumping for breastmilk.   Consults: None  Significant Diagnostic Studies: labs:  Results for orders placed or performed during the hospital encounter of 12/25/15 (from the past 48 hour(s))  Urinalysis, Routine w reflex microscopic (not at [facility])     Status: Abnormal   Collection Time: 12/25/15  7:40 PM  Result Value Ref Range   Color, Urine YELLOW YELLOW   APPearance CLEAR CLEAR   Specific Gravity, Urine <1.005 (L) 1.005 - 1.030   pH 5.5 5.0 - 8.0   Glucose, UA NEGATIVE NEGATIVE mg/dL   Hgb urine dipstick LARGE (A) NEGATIVE   Bilirubin Urine NEGATIVE NEGATIVE   Ketones, ur 15 (A) NEGATIVE mg/dL   Protein, ur NEGATIVE NEGATIVE mg/dL   Nitrite NEGATIVE NEGATIVE   Leukocytes, UA MODERATE (A) NEGATIVE  Urine microscopic-add on     Status: Abnormal    Collection Time: 12/25/15  7:40 PM  Result Value Ref Range   Squamous Epithelial / LPF 0-5 (A) NONE SEEN   WBC, UA 6-30 0 - 5 WBC/hpf   RBC / HPF 6-30 0 - 5 RBC/hpf   Bacteria, UA FEW (A) NONE SEEN  OB Urine Culture     Status: Abnormal   Collection Time: 12/25/15  7:40 PM  Result Value Ref Range   Specimen Description URINE, RANDOM    Special Requests NONE    Culture (A)     <10,000 COLONIES/mL INSIGNIFICANT GROWTH NO GROUP B STREP (S.AGALACTIAE) ISOLATED Performed at [facility]    Report Status 12/27/2015 FINAL   Culture, blood (Routine X 2) w Reflex to ID Panel     Status: None (Preliminary result)   Collection Time: 12/25/15  9:57 PM  Result Value Ref Range   Specimen Description BLOOD  RIGHT ARM    Special Requests BOTTLES DRAWN AEROBIC ONLY  5ML    Culture      NO GROWTH < 24 HOURS Performed at [facility]    Report Status PENDING   CBC with Differential/Platelet     Status: Abnormal   Collection Time: 12/25/15 10:08 PM  Result Value Ref Range   WBC 21.1 (H) 4.0 - 10.5 K/uL   RBC 3.45 (L) 3.87 - 5.11 MIL/uL   Hemoglobin 10.5 (L) 12.0 - 15.0 g/dL   HCT 30.6 (L) 36.0 -  46.0 %   MCV 88.7 78.0 - 100.0 fL   MCH 30.4 26.0 - 34.0 pg   MCHC 34.3 30.0 - 36.0 g/dL   RDW 13.5 11.5 - 15.5 %   Platelets 306 150 - 400 K/uL   Neutrophils Relative % 88 %   Neutro Abs 18.5 (H) 1.7 - 7.7 K/uL   Lymphocytes Relative 8 %   Lymphs Abs 1.8 0.7 - 4.0 K/uL   Monocytes Relative 4 %   Monocytes Absolute 0.8 0.1 - 1.0 K/uL   Eosinophils Relative 0 %   Eosinophils Absolute 0.0 0.0 - 0.7 K/uL   Basophils Relative 0 %   Basophils Absolute 0.0 0.0 - 0.1 K/uL  Comprehensive metabolic panel     Status: Abnormal   Collection Time: 12/25/15 10:08 PM  Result Value Ref Range   Sodium 139 135 - 145 mmol/L   Potassium 3.2 (L) 3.5 - 5.1 mmol/L   Chloride 107 101 - 111 mmol/L   CO2 22 22 - 32 mmol/L   Glucose, Bld 104 (H) 65 - 99 mg/dL   BUN 7 6 - 20 mg/dL   Creatinine, Ser  0.79 0.44 - 1.00 mg/dL   Calcium 8.6 (L) 8.9 - 10.3 mg/dL   Total Protein 6.7 6.5 - 8.1 g/dL   Albumin 3.1 (L) 3.5 - 5.0 g/dL   AST 18 15 - 41 U/L   ALT 28 14 - 54 U/L   Alkaline Phosphatase 95 38 - 126 U/L   Total Bilirubin 0.9 0.3 - 1.2 mg/dL   GFR calc non Af Amer >60 >60 mL/min   GFR calc Af Amer >60 >60 mL/min    Comment: (NOTE) The eGFR has been calculated using the CKD EPI equation. This calculation has not been validated in all clinical situations. eGFR's persistently <60 mL/min signify possible Chronic Kidney Disease.    Anion gap 10 5 - 15  Culture, blood (Routine X 2) w Reflex to ID Panel     Status: None (Preliminary result)   Collection Time: 12/25/15 10:08 PM  Result Value Ref Range   Specimen Description BLOOD RIGHT ARM    Special Requests BOTTLES DRAWN AEROBIC AND ANAEROBIC  7ML AND 8ML    Culture      NO GROWTH < 24 HOURS Performed at [facility]    Report Status PENDING   Urinalysis with microscopic (not at [facility])     Status: Abnormal   Collection Time: 12/25/15 10:52 PM  Result Value Ref Range   Color, Urine YELLOW YELLOW   APPearance CLEAR CLEAR   Specific Gravity, Urine 1.015 1.005 - 1.030   pH 6.0 5.0 - 8.0   Glucose, UA NEGATIVE NEGATIVE mg/dL   Hgb urine dipstick LARGE (A) NEGATIVE   Bilirubin Urine NEGATIVE NEGATIVE   Ketones, ur 40 (A) NEGATIVE mg/dL   Protein, ur NEGATIVE NEGATIVE mg/dL   Nitrite NEGATIVE NEGATIVE   Leukocytes, UA TRACE (A) NEGATIVE   WBC, UA 0-5 0 - 5 WBC/hpf   RBC / HPF 6-30 0 - 5 RBC/hpf   Bacteria, UA FEW (A) NONE SEEN   Squamous Epithelial / LPF 0-5 (A) NONE SEEN  Protein / creatinine ratio, urine     Status: None   Collection Time: 12/25/15 10:52 PM  Result Value Ref Range   Creatinine, Urine 163.00 mg/dL   Total Protein, Urine 15 mg/dL    Comment: NO NORMAL RANGE ESTABLISHED FOR THIS TEST   Protein Creatinine Ratio 0.09 0.00 - 0.15 mg/mg[Cre]  CBC  with Differential/Platelet     Status: Abnormal   Collection  Time: 12/26/15  7:56 AM  Result Value Ref Range   WBC 19.1 (H) 4.0 - 10.5 K/uL   RBC 3.27 (L) 3.87 - 5.11 MIL/uL   Hemoglobin 9.8 (L) 12.0 - 15.0 g/dL   HCT 29.1 (L) 36.0 - 46.0 %   MCV 89.0 78.0 - 100.0 fL   MCH 30.0 26.0 - 34.0 pg   MCHC 33.7 30.0 - 36.0 g/dL   RDW 13.5 11.5 - 15.5 %   Platelets 271 150 - 400 K/uL   Neutrophils Relative % 82 %   Neutro Abs 15.5 (H) 1.7 - 7.7 K/uL   Lymphocytes Relative 12 %   Lymphs Abs 2.3 0.7 - 4.0 K/uL   Monocytes Relative 5 %   Monocytes Absolute 1.0 0.1 - 1.0 K/uL   Eosinophils Relative 1 %   Eosinophils Absolute 0.2 0.0 - 0.7 K/uL   Basophils Relative 0 %   Basophils Absolute 0.0 0.0 - 0.1 K/uL  Basic metabolic panel     Status: Abnormal   Collection Time: 12/26/15  7:56 AM  Result Value Ref Range   Sodium 141 135 - 145 mmol/L   Potassium 3.8 3.5 - 5.1 mmol/L   Chloride 111 101 - 111 mmol/L   CO2 24 22 - 32 mmol/L   Glucose, Bld 97 65 - 99 mg/dL   BUN 8 6 - 20 mg/dL   Creatinine, Ser 0.64 0.44 - 1.00 mg/dL   Calcium 8.4 (L) 8.9 - 10.3 mg/dL   GFR calc non Af Amer >60 >60 mL/min   GFR calc Af Amer >60 >60 mL/min    Comment: (NOTE) The eGFR has been calculated using the CKD EPI equation. This calculation has not been validated in all clinical situations. eGFR's persistently <60 mL/min signify possible Chronic Kidney Disease.    Anion gap 6 5 - 15  CBC with Differential/Platelet     Status: Abnormal   Collection Time: 12/27/15  5:45 AM  Result Value Ref Range   WBC 12.8 (H) 4.0 - 10.5 K/uL   RBC 3.21 (L) 3.87 - 5.11 MIL/uL   Hemoglobin 9.3 (L) 12.0 - 15.0 g/dL   HCT 28.7 (L) 36.0 - 46.0 %   MCV 89.4 78.0 - 100.0 fL   MCH 29.0 26.0 - 34.0 pg   MCHC 32.4 30.0 - 36.0 g/dL   RDW 13.6 11.5 - 15.5 %   Platelets 290 150 - 400 K/uL   Neutrophils Relative % 71 %   Neutro Abs 9.0 (H) 1.7 - 7.7 K/uL   Lymphocytes Relative 22 %   Lymphs Abs 2.8 0.7 - 4.0 K/uL   Monocytes Relative 4 %   Monocytes Absolute 0.5 0.1 - 1.0 K/uL    Eosinophils Relative 3 %   Eosinophils Absolute 0.4 0.0 - 0.7 K/uL   Basophils Relative 0 %   Basophils Absolute 0.0 0.0 - 0.1 K/uL    Treatments: IV hydration and antibiotics: Unasyn  Discharge Exam: Blood pressure 129/74, pulse 62, temperature 98 F (36.7 C), temperature source Oral, resp. rate 18, height _0  (1.651 m), weight 115.667 kg (255 lb), last menstrual period 03/01/2015, SpO2 99 %, currently breastfeeding. General appearance: alert Resp: clear to auscultation bilaterally Breasts: normal appearance, no masses or tenderness, Lactating Cardio: regular rate and rhythm, S1, S2 normal, no murmur, click, rub or gallop GI: soft, non-tender; bowel sounds normal; no masses,  no organomegaly Pelvic: Uterus well-involuted, NT, scant lochia Extremities: extremities  normal, atraumatic, no cyanosis or edema Incision/Wound:  LTCS incision healing well, no drainage or cellulitis  Disposition: 01-Home or Self Care     Medication List    TAKE these medications        amoxicillin-clavulanate 875-125 MG tablet  Commonly known as:  AUGMENTIN  Take 1 tablet by mouth 2 (two) times daily.     calcium carbonate 500 MG chewable tablet  Commonly known as:  TUMS - dosed in mg elemental calcium  Chew 2 tablets by mouth daily as needed for indigestion or heartburn.     docusate sodium 100 MG capsule  Commonly known as:  COLACE  Take 1 capsule (100 mg total) by mouth 2 (two) times daily.     ibuprofen 800 MG tablet  Commonly known as:  ADVIL,MOTRIN  Take 1 tablet (800 mg total) by mouth every 8 (eight) hours as needed.     oxyCODONE-acetaminophen 5-325 MG tablet  Commonly known as:  ROXICET  Take 1 tablet by mouth every 4 (four) hours as needed for severe pain.     prenatal multivitamin Tabs tablet  Take 1 tablet by mouth daily at 12 noon.     simethicone 80 MG chewable tablet  Commonly known as:  MYLICON  Chew 80 mg by mouth 2 (two) times daily as needed (for gas.).            Follow-up Information    Follow up with [facility]. Schedule an appointment as soon as possible for a visit in 1 week.   Specialty:  Obstetrics and Gynecology   Why:  Office will call you to schedule follow-up visit at [place]. Call with any questions or concerns.   Contact information:   [address] [place] [address] [phone]      Signed: Donnel Saxon 12/27/2015, 11:37 AM

## 2015-12-30 LAB — CULTURE, BLOOD (ROUTINE X 2)
Culture: NO GROWTH
Culture: NO GROWTH

## 2016-09-12 ENCOUNTER — Encounter (HOSPITAL_BASED_OUTPATIENT_CLINIC_OR_DEPARTMENT_OTHER): Payer: Self-pay | Admitting: *Deleted

## 2016-09-12 ENCOUNTER — Emergency Department (HOSPITAL_BASED_OUTPATIENT_CLINIC_OR_DEPARTMENT_OTHER)
Admission: EM | Admit: 2016-09-12 | Discharge: 2016-09-12 | Disposition: A | Discharge status: Home / Self Care | Payer: BLUE CROSS/BLUE SHIELD | Attending: Physician Assistant | Admitting: Physician Assistant

## 2016-09-12 DIAGNOSIS — H209 Unspecified iridocyclitis: Secondary | ICD-10-CM | POA: Diagnosis not present

## 2016-09-12 DIAGNOSIS — H5711 Ocular pain, right eye: Secondary | ICD-10-CM | POA: Diagnosis present

## 2016-09-12 MED ORDER — CLINDAMYCIN HCL 150 MG PO CAPS
300.0000 mg | ORAL_CAPSULE | Freq: Once | ORAL | Status: AC
  Administered 2016-09-12: 300 mg via ORAL
  Filled 2016-09-12: qty 2

## 2016-09-12 MED ORDER — CIPROFLOXACIN HCL 0.3 % OP SOLN
2.0000 [drp] | Freq: Once | OPHTHALMIC | Status: AC
  Administered 2016-09-12: 2 [drp] via OPHTHALMIC
  Filled 2016-09-12: qty 2.5

## 2016-09-12 MED ORDER — FLUORESCEIN SODIUM 0.6 MG OP STRP
ORAL_STRIP | OPHTHALMIC | Status: AC
  Filled 2016-09-12: qty 1

## 2016-09-12 MED ORDER — CLINDAMYCIN HCL 150 MG PO CAPS: 300.0000 mg | ORAL_CAPSULE | Freq: Four times a day (QID) | ORAL | 0 refills | Status: AC

## 2016-09-12 NOTE — ED Provider Notes (Signed)
[facility]ENCY [facility]PT [facility] Provider Note   CSN: [id number] Arrival date & time: 09/12/16  1625  By signing my name below, I, Modena JanskyAlbert Thayil, attest that this documentation has been prepared under the direction and in the presence of Ander Wamser Randall AnLyn Abeeha Twist, MD. Electronically Signed: Modena JanskyAlbert Thayil, Scribe. 09/12/2016. 4:53 PM.  History   Chief Complaint Chief Complaint  Patient presents with  . Eye Pain   The history is provided by the patient. No language interpreter was used.    HPI Comments: [name] Goutysther Kaelin is a 21 y.o. female who presents to the Emergency Department complaining of constant moderate right eye redness that started about 3 days ago. She states woke up with redness and associated periorbital swelling that worsened this morning. No modifying factors. She denies any visual disturbance, eye pain, eye discharge, eye itching, or other complaints.   Past Medical History:  Diagnosis Date  . Medical history non-contributory     Patient Active Problem List   Diagnosis Date Noted  . Postpartum endometritis 12/25/2015  . S/P primary low transverse C-section 12/17/2015  . Anemia 12/17/2015    Past Surgical History:  Procedure Laterality Date  . CESAREAN SECTION N/A 12/17/2015   Procedure: CESAREAN SECTION;  Surgeon: Geryl RankinsEvelyn Varnado, MD;  [facility];  Service: Obstetrics;  Laterality: N/A;    OB History    Gravida Para Term Preterm AB Living   1 1 1  0 0 1   SAB TAB Ectopic Multiple Live Births   0 0 0 0 1       Home Medications    Prior to Admission medications   Not on File    Family History History reviewed. No pertinent family history.  Social History Social History  Substance Use Topics  . Smoking status: Never Smoker  . Smokeless tobacco: Never Used  . Alcohol use No     Allergies   Patient has no known allergies.   Review of Systems Review of Systems  Eyes: Positive for redness. Negative for pain, discharge, itching and visual  disturbance.       +Periorbital swelling     Physical Exam Updated Vital Signs BP 122/77 (BP Location: Left Arm)   Pulse 84   Temp 98.3 F (36.8 C) (Oral)   Resp 18   Ht 5\' 5"  (1.651 m)   Wt 220 lb (99.8 kg)   SpO2 100%   BMI 36.61 kg/m   Physical Exam  Constitutional: She appears well-developed and well-nourished. No distress.  HENT:  Head: Normocephalic.  Eyes: Conjunctivae are normal.  Corneal injection diffusely with mild swelling and tearing.  No evidence of purulent discharge.   Neck: Neck supple.  Cardiovascular: Normal rate and regular rhythm.   Pulmonary/Chest: Effort normal.  Abdominal: Soft.  Musculoskeletal: Normal range of motion.  Neurological: She is alert.  Skin: Skin is warm and dry.  Psychiatric: She has a normal mood and affect.  Nursing note and vitals reviewed.    ED Treatments / Results  DIAGNOSTIC STUDIES: Oxygen Saturation is 100% on RA, normal by my interpretation.    COORDINATION OF CARE: 4:57 PM- Pt advised of plan for treatment and pt agrees.  Labs (all labs ordered are listed, but only abnormal results are displayed) Labs Reviewed - No data to display  EKG  EKG Interpretation None       Radiology No results found.  Procedures Procedures (including critical care time)  Medications Ordered in ED Medications  fluorescein 0.6 MG ophthalmic strip (not administered)  ciprofloxacin (CILOXAN) 0.3 % ophthalmic solution 2 drop (not administered)  clindamycin (CLEOCIN) capsule 300 mg (300 mg Oral Given 09/12/16 1743)     Initial Impression / Assessment and Plan / ED Course  I have reviewed the triage vital signs and the nursing notes.  Pertinent labs & imaging results that were available during my care of the patient were reviewed by me and considered in my medical decision making (see chart for details).    I personally performed the services described in this documentation, which was scribed in my presence. The recorded  information has been reviewed and is accurate.   Patient is a pleasant 21 year old female presenting with right eye symptoms. She reports that she knows that was red starting yesterday. Her eyes been watering. She has absolutely no pain. She reports mild grittiness.  No fevers. No pain with extra ocular movements. Patient's no visual changes changes. Patient does have ophthalmologist.\  5:55 PM   Fluoroscien negative. Is unclear to me what the pathology is. I would suspect with uveitis or iritis to have some type of pain. Given a congenital irritation I would expect this discomfort but she has none. It or just allergic expected to be bilateral. Patient does have mild swelling around the eye so I can't rule out preseptal cellulitis. Given that she has normal visual acuity, and no pain, and no evidence of infection I think she can be seen on Monday by ophthalmology.  Patient has her own ophthalmologist. However we will give her the name of our ophthalmologist on-call as well.  We will conservative and treat her with both topical and oral antibiotics. (pt is contact wearer)       Final Clinical Impressions(s) / ED Diagnoses   Final diagnoses:  None    New Prescriptions New Prescriptions   No medications on file      Yaniel Limbaugh Randall An, MD 09/12/16 1755

## 2016-09-12 NOTE — ED Triage Notes (Signed)
Pt reports right eye swelling x 3 days.  No change in vision

## 2016-09-12 NOTE — ED Notes (Signed)
Patient denies pain and is resting comfortably.  

## 2016-09-12 NOTE — Discharge Instructions (Signed)
We are unsure what is causing your symptoms. However could be infection around the eye. Please take antibiotics by mouth as prescribed. In addition please take antibiotics for your eye. We will need a follow-up on Monday with her ophthalmologist. We will also continue the name of our ophthalmologist on-call in case yours is unable to see you.

## 2016-12-23 ENCOUNTER — Ambulatory Visit: Payer: Self-pay | Admitting: Family Medicine

## 2016-12-23 ENCOUNTER — Encounter: Payer: Self-pay | Admitting: Family Medicine

## 2016-12-23 ENCOUNTER — Ambulatory Visit (INDEPENDENT_AMBULATORY_CARE_PROVIDER_SITE_OTHER): Payer: BLUE CROSS/BLUE SHIELD | Admitting: Family Medicine

## 2016-12-23 VITALS — BP 120/70 | HR 75 | Ht 66.5 in | Wt 209.2 lb

## 2016-12-23 DIAGNOSIS — J4520 Mild intermittent asthma, uncomplicated: Secondary | ICD-10-CM | POA: Diagnosis not present

## 2016-12-23 DIAGNOSIS — F329 Major depressive disorder, single episode, unspecified: Secondary | ICD-10-CM

## 2016-12-23 DIAGNOSIS — F4321 Adjustment disorder with depressed mood: Secondary | ICD-10-CM

## 2016-12-23 DIAGNOSIS — Z7689 Persons encountering health services in other specified circumstances: Secondary | ICD-10-CM

## 2016-12-23 DIAGNOSIS — J45909 Unspecified asthma, uncomplicated: Secondary | ICD-10-CM | POA: Insufficient documentation

## 2016-12-23 DIAGNOSIS — F32A Depression, unspecified: Secondary | ICD-10-CM

## 2016-12-23 DIAGNOSIS — F4320 Adjustment disorder, unspecified: Secondary | ICD-10-CM

## 2016-12-23 DIAGNOSIS — F419 Anxiety disorder, unspecified: Secondary | ICD-10-CM | POA: Diagnosis not present

## 2016-12-23 DIAGNOSIS — J302 Other seasonal allergic rhinitis: Secondary | ICD-10-CM | POA: Insufficient documentation

## 2016-12-23 MED ORDER — ALPRAZOLAM 0.25 MG PO TABS
0.2500 mg | ORAL_TABLET | Freq: Two times a day (BID) | ORAL | 0 refills | Status: DC | PRN
Start: 2016-12-23 — End: 2017-04-30

## 2016-12-23 NOTE — Patient Instructions (Signed)
You can call to schedule your appointment with the Counselor. A couple offices are listed below for you to call.   [facility] [phone] [address] [place]  [place] [phone]   [facility]  [address], [place], [place] [address]  Phone: [phone]  [facility] [address] [place], [place] [address]  Phone: [phone]  [facility]  [address], [place], [place] [address] Phone: [phone]

## 2016-12-23 NOTE — Progress Notes (Addendum)
Subjective:    Patient ID: [name] [name], female    DOB: 1996/06/20, 21 y.o.   MRN: [id number]  HPI Chief Complaint  Patient presents with  . new pt    new pt. having aniexty issues- years. been dx with aniexty. had a therapist back at home but not here.    She is new to the practice and here to establish care.  [place]  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   Concerns today include: anxiety. Reports history of anxiety problems since HS. States her anxiety was manageable previously but since having a child in May 2017 and her grandfather dying the same week, she has had issues coping. Has seen a therapist in the distant past. States she has had panic attacks, has been short tempered and irritable at work. Denies taking medication in the past for anxiety or depression.  Denies SI or HI.   Denies fever, chills, unexplained weight change, changes in bowel habits, chest pain, palpitations, shortness of breath, abdominal pain, N/V/D.   History of mild asthma and no flares since 2015.   Works at [employer] and is going to start back to school at [employer] this fall.   IUD for contraception.  OB/GYN Naima Dillard    Depression screen [facility] 12/23/2016 09/27/2015 09/03/2015  Decreased Interest 0 0 0  Down, Depressed, Hopeless 3 0 0  PHQ - 2 Score 3 0 0  Altered sleeping 2 - -  Tired, decreased energy 2 - -  Change in appetite 3 - -  Feeling bad or failure about yourself  0 - -  Trouble concentrating 3 - -  Moving slowly or fidgety/restless 0 - -  Suicidal thoughts 0 - -  PHQ-9 Score 13 - -   GAD 7 : Generalized Anxiety Score 12/23/2016  Nervous, Anxious, on Edge 2  Control/stop worrying 3  Worry too much - different things 3  Trouble relaxing 3  Restless 2  Easily annoyed or irritable 3  Afraid - awful might happen 2  Total GAD 7 Score 18  Anxiety Difficulty Somewhat difficult    Reviewed allergies, medications, past  medical, surgical, family, and social history.    Review of Systems Pertinent positives and negatives in the history of present illness.     Objective:   Physical Exam BP 120/70   Pulse 75   Ht 5' 6.5" (1.689 m)   Wt 209 lb 3.2 oz (94.9 kg)   LMP 12/09/2016 Comment: been on cycle since may 16th  Breastfeeding? No   BMI 33.26 kg/m  Alert and in no distress.  Pharyngeal area is normal. Neck is supple without adenopathy or thyromegaly. Cardiac exam shows a regular sinus rhythm without murmurs or gallops. Lungs are clear to auscultation.      Assessment & Plan:  Anxiety and depression - Plan: CBC with Differential/Platelet, Comprehensive metabolic panel, TSH  Encounter to establish care  Grief  Mild intermittent asthma without complication  She is visibly sad and anxious. Recommend that she schedule an appointment with a counselor and she is in agreement. Several names and numbers provided and she will call for an appointment. Will prescribe Alprazolam low dose 5 tablets and she may break these in half. Advised her to use caution if she takes these until she knows how it makes her feel. She is aware that this is a short-term treatment and that counseling is going to help  her deal with how to cope with her grief, anxiety and depression.  Discussed adding a long acting antidepressant such as sertraline or citalopram however she would like to hold off on this for now. She does not appear to be in any danger to herself or others.  No asthma flares since 2015. This appears to be well controlled.  Will check labs and have her follow up in 2 weeks.

## 2016-12-24 LAB — CBC WITH DIFFERENTIAL/PLATELET
BASOS ABS: 0 {cells}/uL (ref 0–200)
Basophils Relative: 0 %
EOS ABS: 62 {cells}/uL (ref 15–500)
EOS PCT: 1 %
HCT: 38 % (ref 35.0–45.0)
HEMOGLOBIN: 12.2 g/dL (ref 11.7–15.5)
LYMPHS ABS: 2232 {cells}/uL (ref 850–3900)
Lymphocytes Relative: 36 %
MCH: 30.3 pg (ref 27.0–33.0)
MCHC: 32.1 g/dL (ref 32.0–36.0)
MCV: 94.3 fL (ref 80.0–100.0)
MONO ABS: 620 {cells}/uL (ref 200–950)
MPV: 10.9 fL (ref 7.5–12.5)
Monocytes Relative: 10 %
NEUTROS ABS: 3286 {cells}/uL (ref 1500–7800)
NEUTROS PCT: 53 %
Platelets: 326 10*3/uL (ref 140–400)
RBC: 4.03 MIL/uL (ref 3.80–5.10)
RDW: 13 % (ref 11.0–15.0)
WBC: 6.2 10*3/uL (ref 4.0–10.5)

## 2016-12-24 LAB — COMPREHENSIVE METABOLIC PANEL
ALT: 13 U/L (ref 6–29)
AST: 23 U/L (ref 10–30)
Albumin: 4.3 g/dL (ref 3.6–5.1)
Alkaline Phosphatase: 75 U/L (ref 33–115)
BUN: 6 mg/dL — ABNORMAL LOW (ref 7–25)
CALCIUM: 9.4 mg/dL (ref 8.6–10.2)
CHLORIDE: 105 mmol/L (ref 98–110)
CO2: 21 mmol/L (ref 20–31)
CREATININE: 0.78 mg/dL (ref 0.50–1.10)
Glucose, Bld: 73 mg/dL (ref 65–99)
Potassium: 3.6 mmol/L (ref 3.5–5.3)
SODIUM: 139 mmol/L (ref 135–146)
TOTAL PROTEIN: 7.1 g/dL (ref 6.1–8.1)
Total Bilirubin: 0.4 mg/dL (ref 0.2–1.2)

## 2016-12-24 LAB — TSH: TSH: 0.56 m[IU]/L

## 2016-12-27 IMAGING — US US MFM OB DETAIL+14 WK
1 series · 14 of 28 positions shown · non-contrast
Comparison: none

[Series 1: us mfm ob detail+14 wk · 68 acquisitions, 14 frames shown]
[im 3/68]
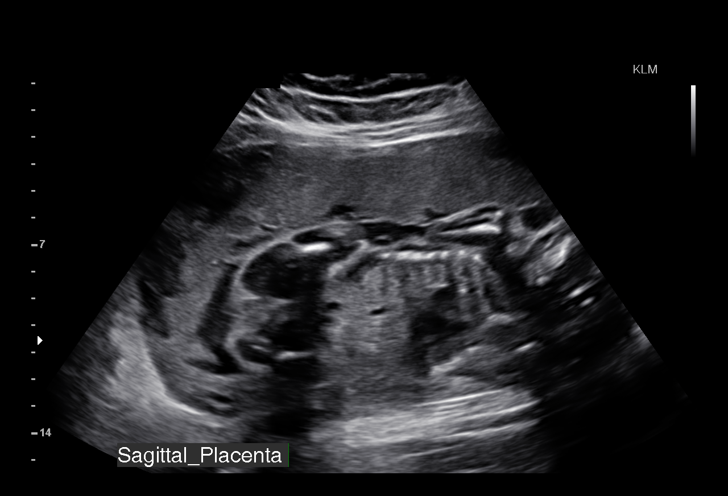
[im 8/68]
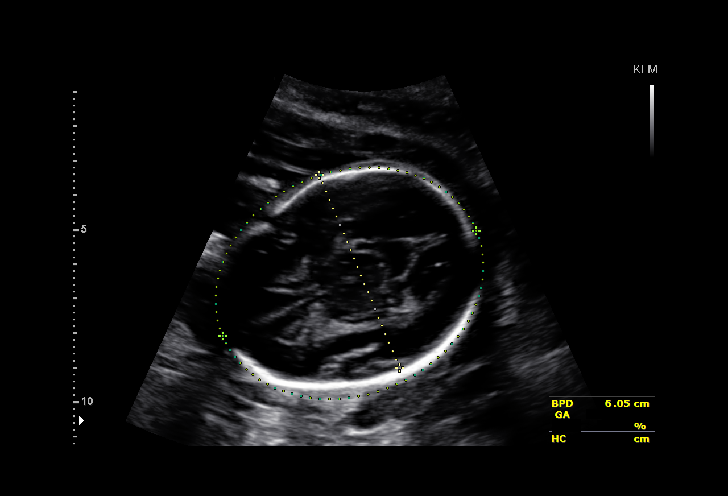
[im 13/68]
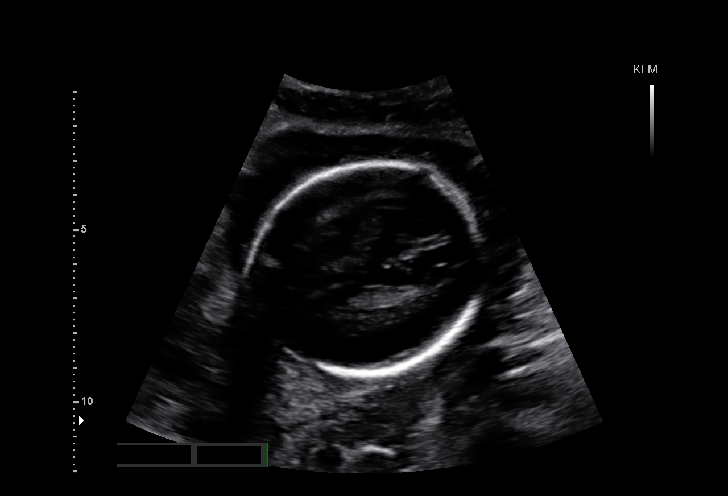
[im 18/68]
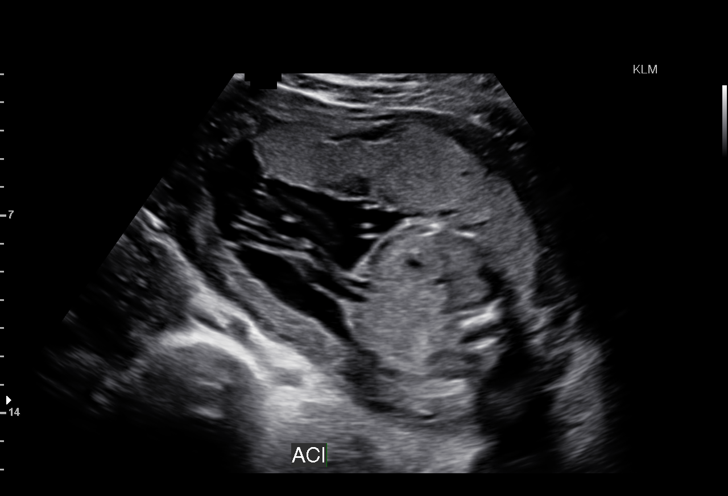
[im 23/68]
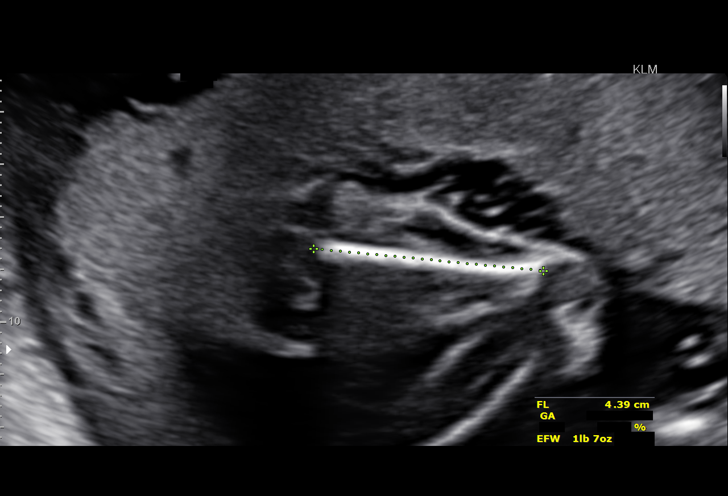
[im 28/68]
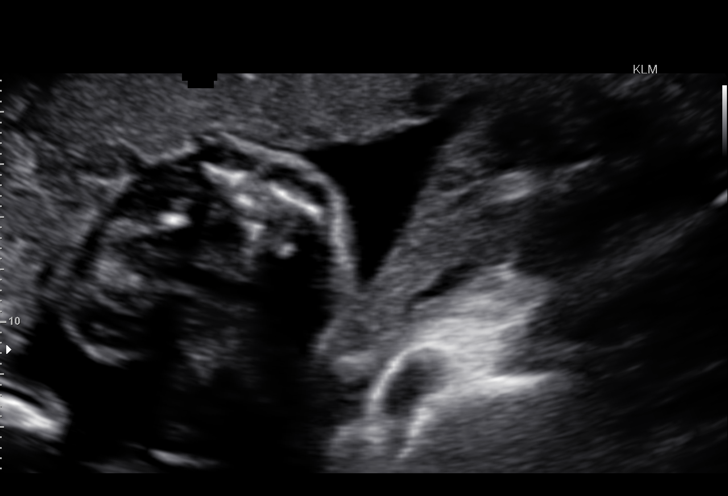
[im 33/68]
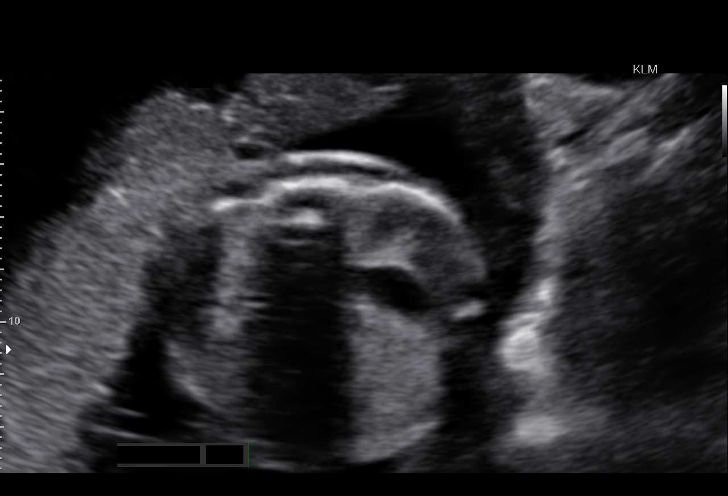
[im 38/68]
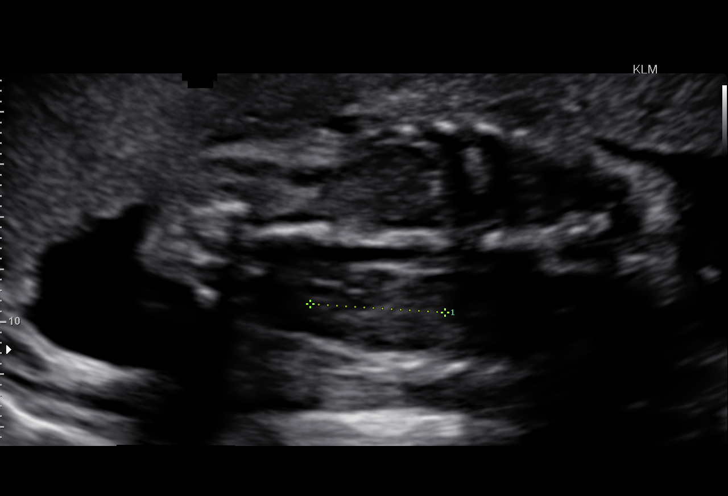
[im 43/68]
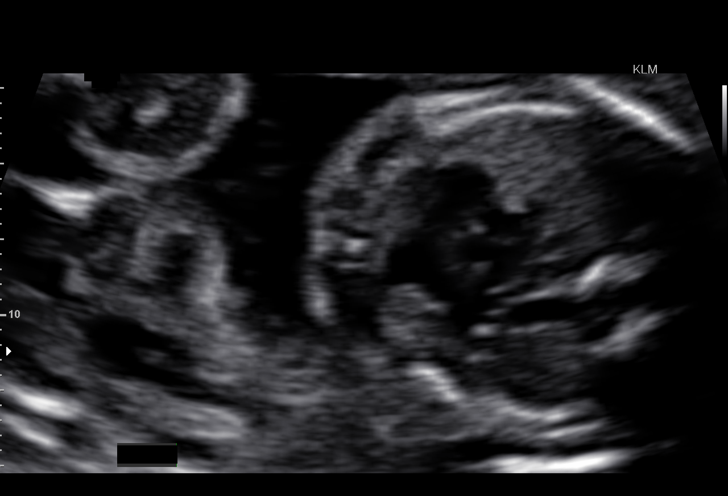
[im 48/68]
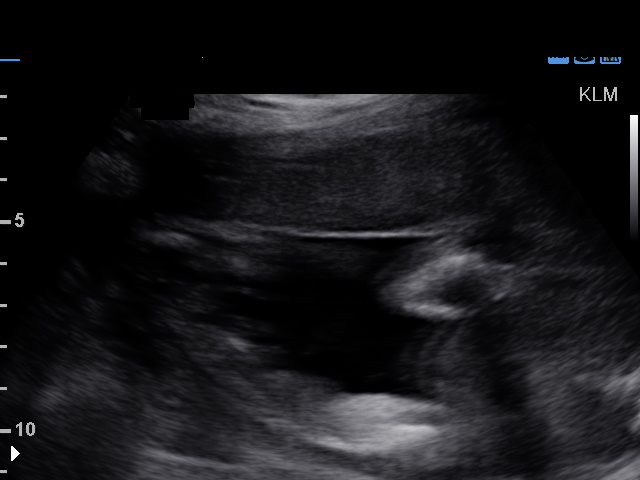
[im 53/68]
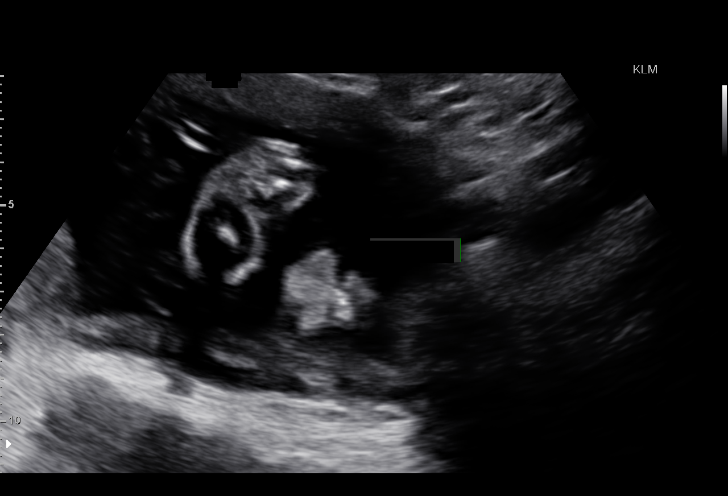
[im 58/68]
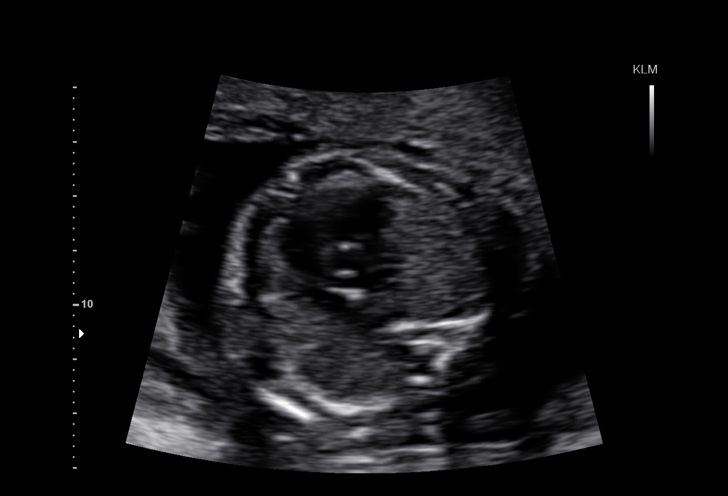
[im 63/68]
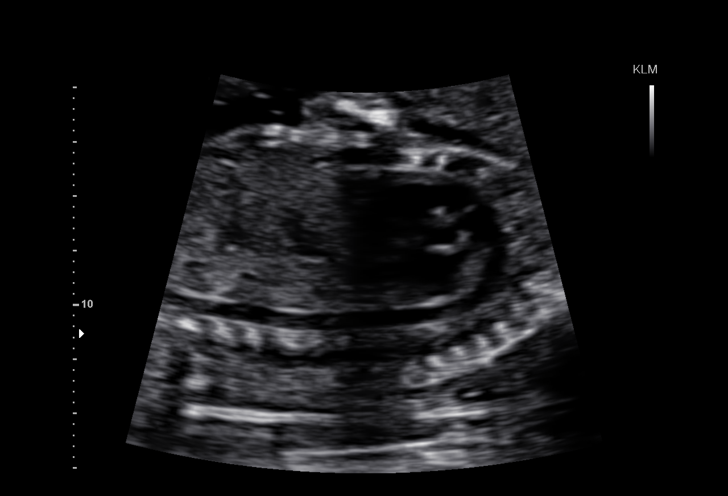
[im 68/68]
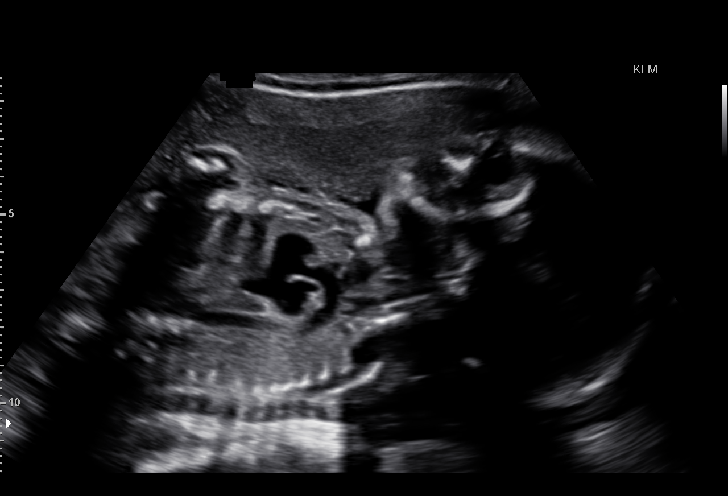

[14 of 28 positions shown; findings below may reference images not displayed]

am)

Name:       LIDYA SALOME                        Visit  09/03/2015 [DATE]
Date:

Gynecology
[address]
[place].

1  DRAGAN NAJDA AGIC KALAC          [id number]       [id number]     [id number]
Indications

Small for gestational age fetus affecting
management of mother
Detailed fetal anatomic survey                  Z36
26 weeks gestation of pregnancy
OB History

Gravidity:     1         Term:  0        Prem:    0        SAB:   0
TOP:           0       Ectopic  0        Living:  0
:
Fetal Evaluation

Num Of Fetuses:      1
Fetal Heart          146
Rate(bpm):
Cardiac Activity:    Observed
Presentation:        Cephalic
Placenta:            Anterior, above cervical os
P. Cord Insertion:   Visualized, central
Amniotic Fluid
AFI FV:      Subjectively within normal limits
Biometry

BPD:      59.6  mm     G. Age:   24w 2d                  CI:        70.08   %    70 - 86
FL/HC:      20.4   %    18.7 -
HC:      227.1  mm     G. Age:   24w 5d        10   %    HC/AC:      1.15        1.04 -
AC:      196.8  mm     G. Age:   24w 3d        14   %    FL/BPD      77.9   %    71 - 87
:
FL:       46.4  mm     G. Age:   25w 3d        37   %    FL/AC:      23.6   %    20 - 24

Est.         734   gm   1 lb 10 oz      38   %
FW:
Gestational Age

LMP:           26w 4d        Date:  03/01/15                  EDD:   12/06/15
U/S Today:     24w 5d                                         EDD:   12/19/15
Best:          25w 3d    Det. By:   Early Ultrasound          EDD:   12/14/15
(05/15/15)
Anatomy

Cranium:          Appears normal         Aortic Arch:       Appears normal
Fetal Cavum:      Appears normal         Ductal Arch:       Not well visualized
Ventricles:       Appears normal         Diaphragm:         Appears normal
Choroid Plexus:   Appears normal         Stomach:           Appears normal,
left sided
Cerebellum:       Appears normal         Abdomen:           Appears normal
Posterior         Appears normal         Abdominal          Appears nml (cord
Fossa:                                   Wall:              insert, abd wall)
Nuchal Fold:      Not applicable (>20    Cord Vessels:      Appears normal (3
wks GA)                                   vessel cord)
Face:             Orbits nl; profile     Kidneys:           Appear normal
not well visualized
Lips:             Not well visualized    Bladder:           Appears normal
Fetal Thoracic:   Appears normal         Spine:             Appears normal
Heart:            Appears normal         Upper              Appears normal
(4CH, axis, and        Extremities:
situs)
RVOT:             Appears normal         Lower              Appears normal
Extremities:
LVOT:             Appears normal

Other:   Fetus appears to be a female. Heels visualized. Technically difficult
due to advanced GA and fetal position.
Cervix Uterus Adnexa

Cervix
Length:             4.1  cm.
Normal appearance by transabdominal scan.
Impression

Single IUP at 25w 3d (based on 9 week ultrasound)
Somewhat limited views of the fetal face (profile) obtained
The remainder of the fetal anatomy appears normal
Fetal growth is appropriate (with revised dates ) - 38th %tile.
Anterior placenta without previa
Normal amniotic fluid volume
Recommendations

Recommend ultrasound in 4 weeks for growth and to
reevaluate the fetal face - please contact our office if you
would prefer that this study be scheduled with MFM.

## 2017-01-06 ENCOUNTER — Ambulatory Visit: Payer: BLUE CROSS/BLUE SHIELD | Admitting: Family Medicine

## 2017-01-07 ENCOUNTER — Ambulatory Visit (INDEPENDENT_AMBULATORY_CARE_PROVIDER_SITE_OTHER): Payer: BLUE CROSS/BLUE SHIELD | Admitting: Family Medicine

## 2017-01-07 ENCOUNTER — Ambulatory Visit: Payer: BLUE CROSS/BLUE SHIELD | Admitting: Family Medicine

## 2017-01-07 VITALS — BP 120/70 | HR 70 | Wt 211.6 lb

## 2017-01-07 DIAGNOSIS — F419 Anxiety disorder, unspecified: Secondary | ICD-10-CM

## 2017-01-07 NOTE — Progress Notes (Signed)
   Subjective:    Patient ID: [name] [name], female    DOB: 12-24-1995, 21 y.o.   MRN: [id number]  HPI Chief Complaint  Patient presents with  . 2 week follow-up    2 week follow-up.  [place] behavior 6/28 and [place] 7/3   She is here for a 2 week follow up on anxiety. States anxiety level is not any worse but not really improved either. She has an appointment to see a counselor on 01/21/2017.  States she took one alprazolam since our last visit. No new concerns.   She is requesting that I fill out FMLA papers to give her time off from her job due to anxiety.   Denies SI or HI. Does not feel depressed or hopeless. States she is actually more hopeful since scheduling with a counselor.   Review of Systems Pertinent positives and negatives in the history of present illness.      Objective:   Physical Exam BP 120/70   Pulse 70   Wt 211 lb 9.6 oz (96 kg)   LMP 12/09/2016 Comment: been on cycle since may 16th  BMI 33.64 kg/m   Alert and oriented and in no acute distress. Normal mood and behavior.      Assessment & Plan:  Anxiety  She is not suicidal. Seems to be stable in regards to anxiety level and looking forward to starting therapy. Will have her discuss with her counselor whether she needs time off from her job due to anxiety. Follow up as needed.

## 2017-01-08 ENCOUNTER — Encounter: Payer: Self-pay | Admitting: Family Medicine

## 2017-01-21 ENCOUNTER — Ambulatory Visit (INDEPENDENT_AMBULATORY_CARE_PROVIDER_SITE_OTHER): Payer: BLUE CROSS/BLUE SHIELD | Admitting: Psychology

## 2017-01-21 DIAGNOSIS — F411 Generalized anxiety disorder: Secondary | ICD-10-CM

## 2017-01-22 ENCOUNTER — Telehealth: Payer: Self-pay | Admitting: Family Medicine

## 2017-01-22 NOTE — Telephone Encounter (Signed)
Attempted to return call to number given and unable to leave a vm.

## 2017-01-22 NOTE — Telephone Encounter (Signed)
Pt called and stated that she went to see Velia MeyerLisa Shelor Flores with behavioral health yesterday. She requested that pt call us and let you know that Colen DarlingLisa Flores would like to speak to you concerning her recommendations. Apparently she called yesterday after the office was closed. You may reach JPMorgan Chase & CoLisa Shelor Flores at [phone].

## 2017-02-10 ENCOUNTER — Telehealth: Payer: Self-pay | Admitting: Internal Medicine

## 2017-02-10 NOTE — Telephone Encounter (Signed)
Darral Dashsther called today and states that Vickie and her Therapist Colen DarlingLisa Flores needs to talk about medication options. Pt was advised to tell Misty StanleyLisa to give Vickie a call since she has signed a form [facility] therapist [facility] to call Vickie. Pt was also advised to either have Misty StanleyLisa call Vickie today or Tomorrow 7/19 as she would be out of the office for 2 weeks.

## 2017-02-11 ENCOUNTER — Telehealth: Payer: Self-pay | Admitting: Family Medicine

## 2017-02-11 NOTE — Telephone Encounter (Signed)
I called the [phone] number several times and each time it said that the number has been changed, disconnected or is not longer in service. Unable to leave a voicemail.

## 2017-02-11 NOTE — Telephone Encounter (Signed)
Colen DarlingLisa Flores, therapist at [facility] called to speak to [facility] about pt. Please call her at [phone] and leave a message of how to contact Vickie in case she is in a session.

## 2017-02-19 ENCOUNTER — Ambulatory Visit: Payer: BLUE CROSS/BLUE SHIELD | Admitting: Psychology

## 2017-03-02 ENCOUNTER — Ambulatory Visit: Payer: BLUE CROSS/BLUE SHIELD | Admitting: Psychology

## 2017-04-10 IMAGING — US US MFM FETAL BPP W/ NON-STRESS
1 series · 10 of 10 positions shown · non-contrast
Comparison: none

[Series 1: us mfm fetal bpp w/ non-stress · 10 acquisitions, 10 frames shown]
[im 1/10]
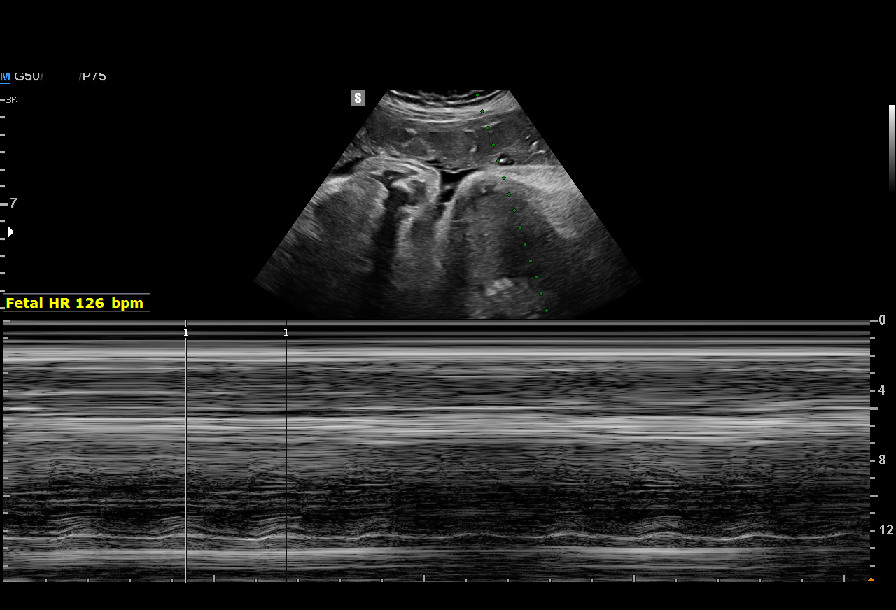
[im 2/10]
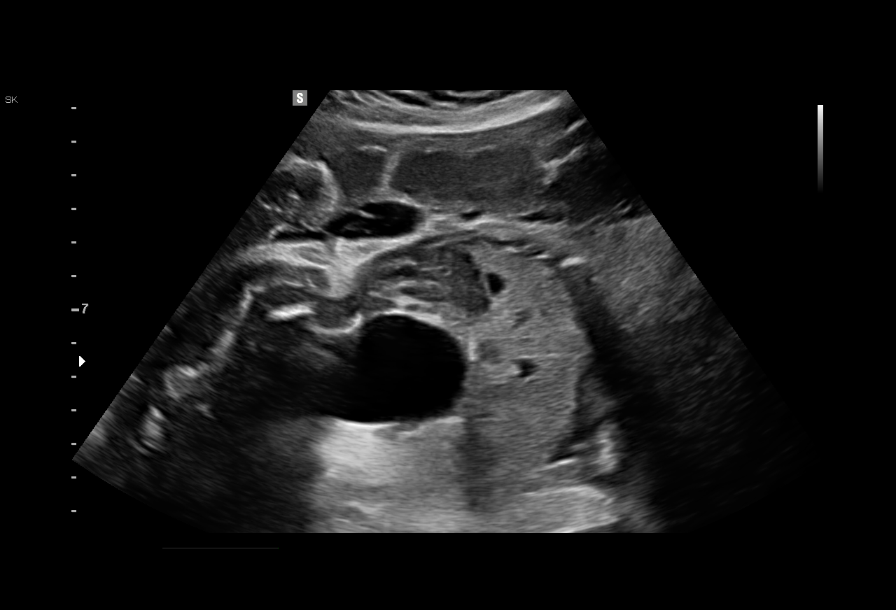
[im 3/10]
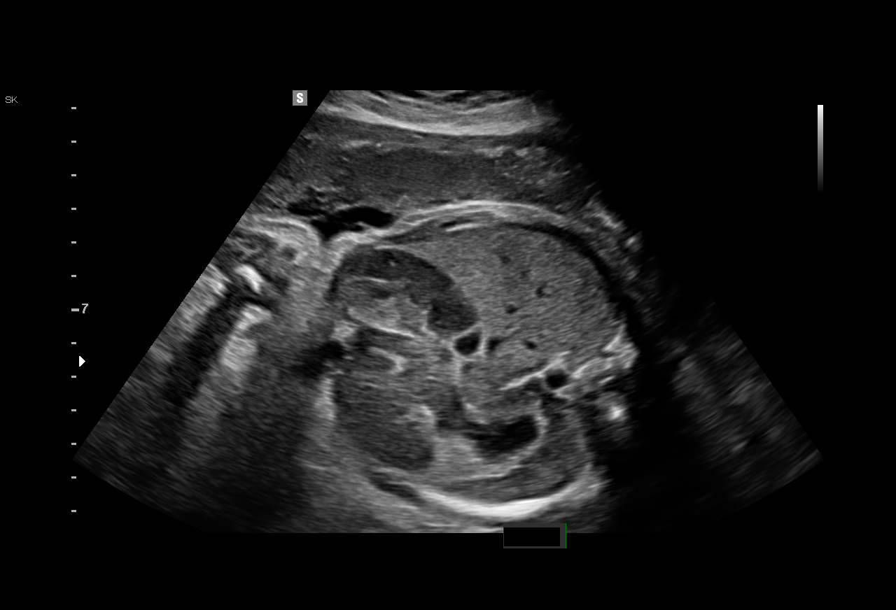
[im 4/10]
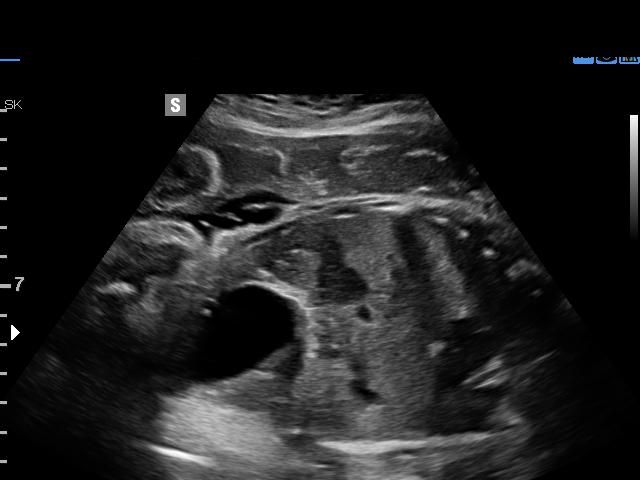
[im 5/10]
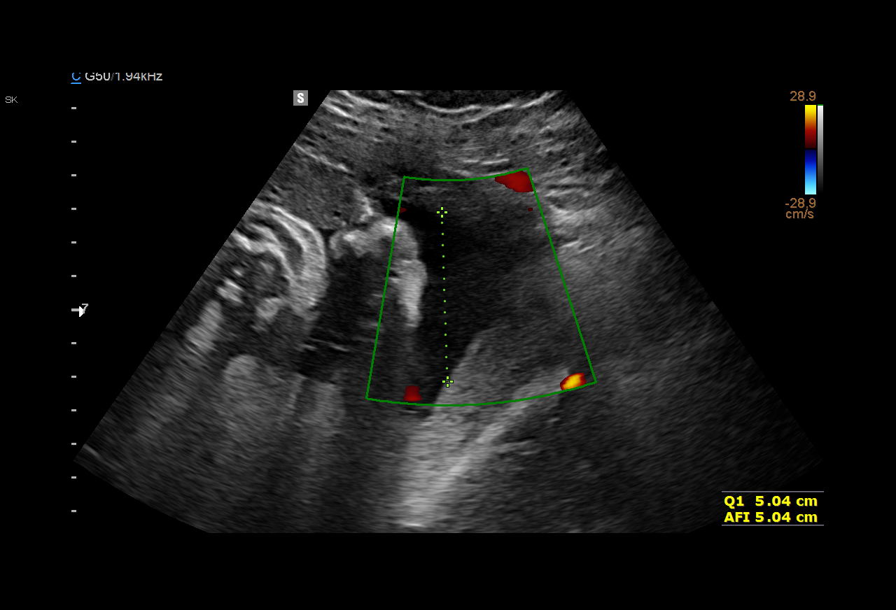
[im 6/10]
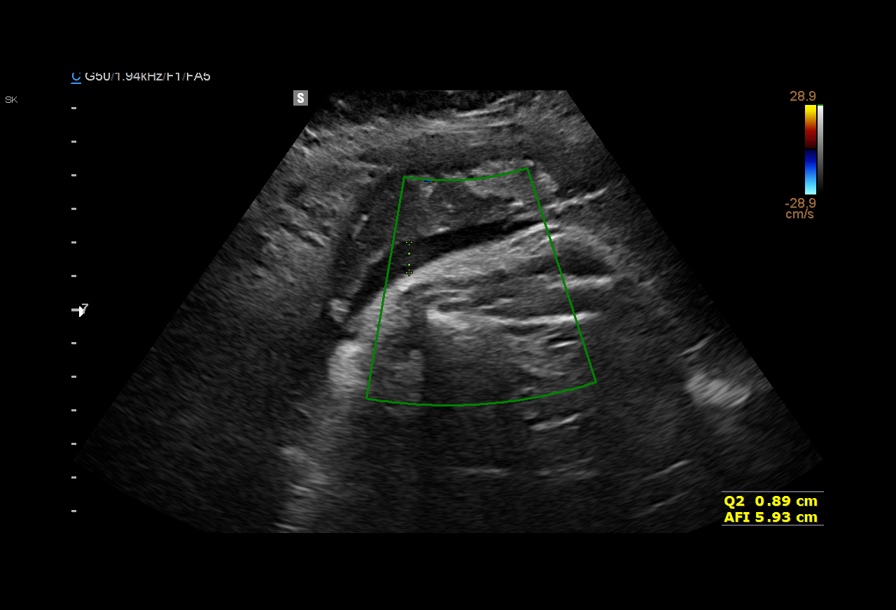
[im 7/10]
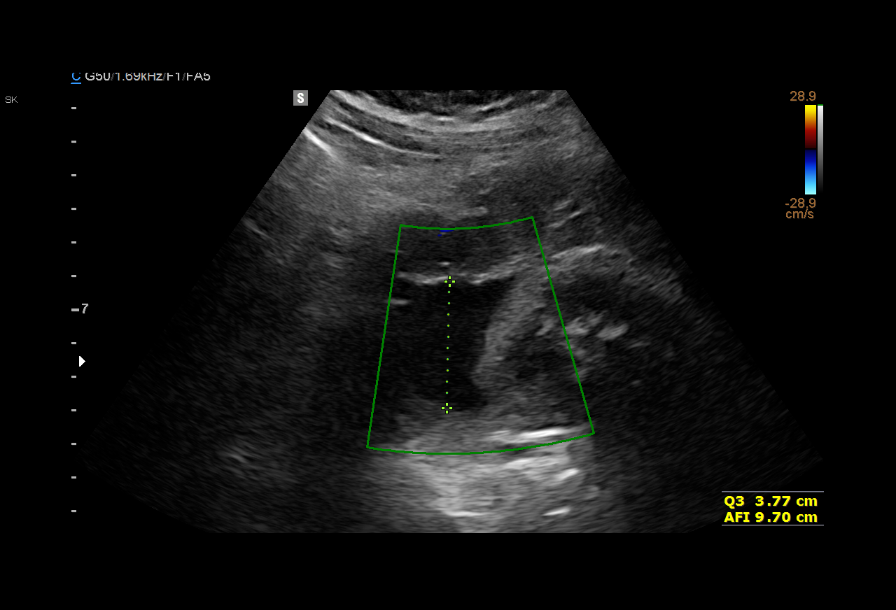
[im 8/10]
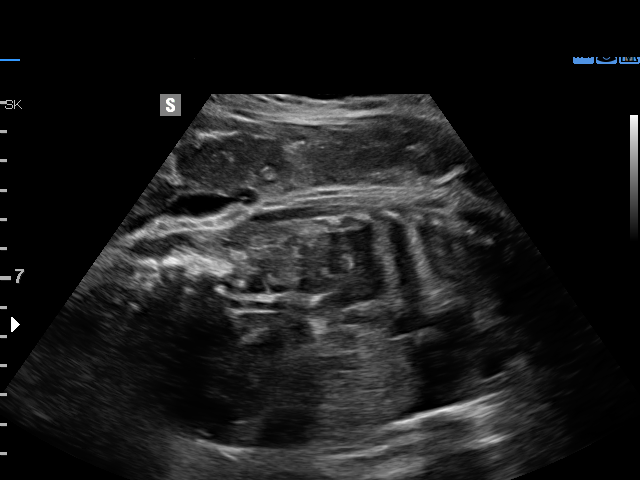
[im 9/10]
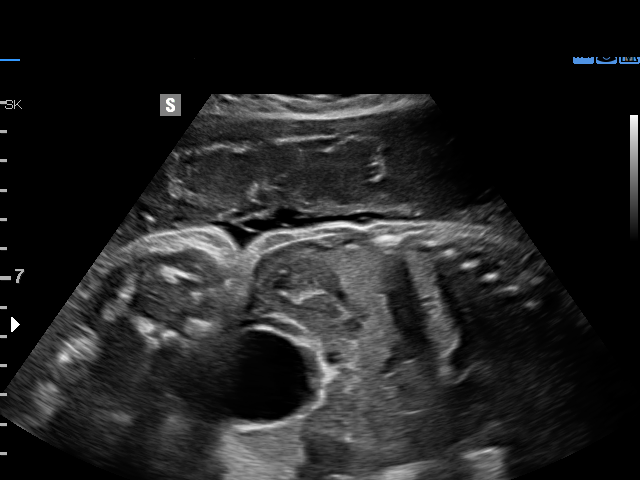
[im 10/10]
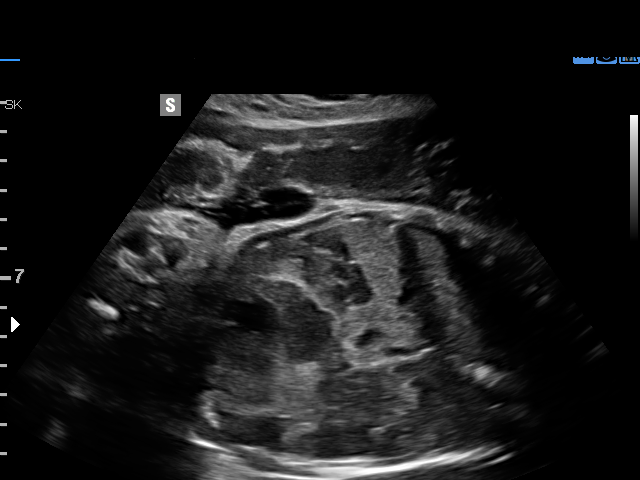

[10 of 10 positions shown; findings below may reference images not displayed]

Obstetrics &
Gynecology
[address]
[place].

1  FELLNER JOAS            [id number]      [id number]     [id number]
Indications

40 weeks gestation of pregnancy
Postdate pregnancy (40-42 weeks)
Obesity complicating pregnancy, third
trimester
6 hour follow up
OB History

Gravidity:    1         Term:   0        Prem:   0        SAB:   0
TOP:          0       Ectopic:  0        Living: 0
Fetal Evaluation

Num Of Fetuses:     1
Fetal Heart         126
Rate(bpm):
Cardiac Activity:   Observed
Presentation:       Cephalic

Amniotic Fluid
AFI FV:      Subjectively within normal limits

AFI Sum(cm)     %Tile       Largest Pocket(cm)
9.7             29

RUQ(cm)                     LUQ(cm)        LLQ(cm)
5.04
Biophysical Evaluation

Amniotic F.V:   Within normal limits       F. Tone:        Observed
F. Movement:    Observed                   Score:          [DATE]
F. Breathing:   Observed
Gestational Age

LMP:           41w 3d        Date:  03/01/15                 EDD:   12/06/15
Best:          40w 2d     Det. By:  Early Ultrasound         EDD:   12/14/15
(05/15/15)
Anatomy

Stomach:               Appears normal, left   Bladder:                Appears normal
sided
Impression

SIUP at 48w0d (remote read BPP only)
active singleton
BPP [DATE]
AFI is normal
Recommendations

Management as per OB provider.

## 2017-04-10 IMAGING — US US MFM FETAL BPP W/O NON-STRESS
1 series · 15 of 22 positions shown · non-contrast
Comparison: none

[Series 1: us mfm fetal bpp w/o non-stress · 22 acquisitions, 15 frames shown]
[im 1/22]
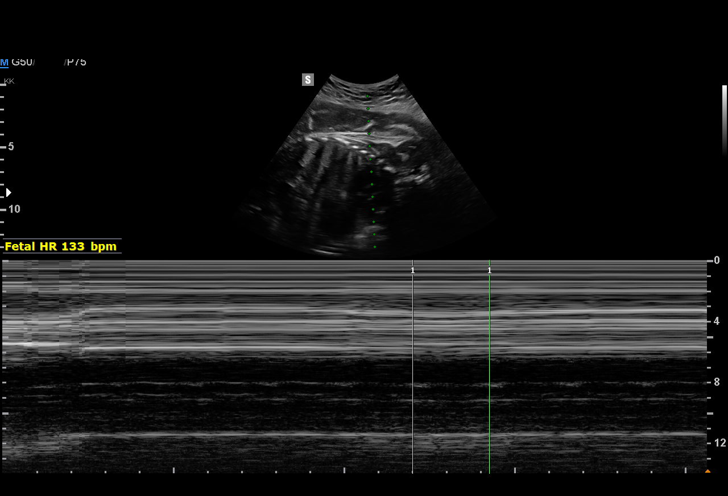
[im 3/22]
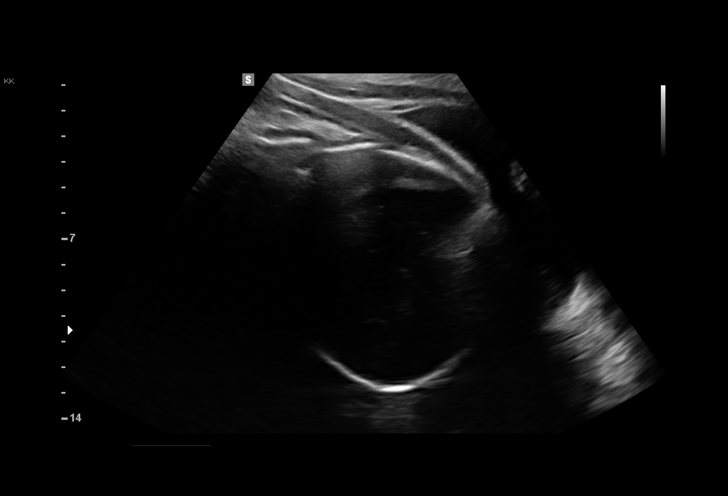
[im 4/22]
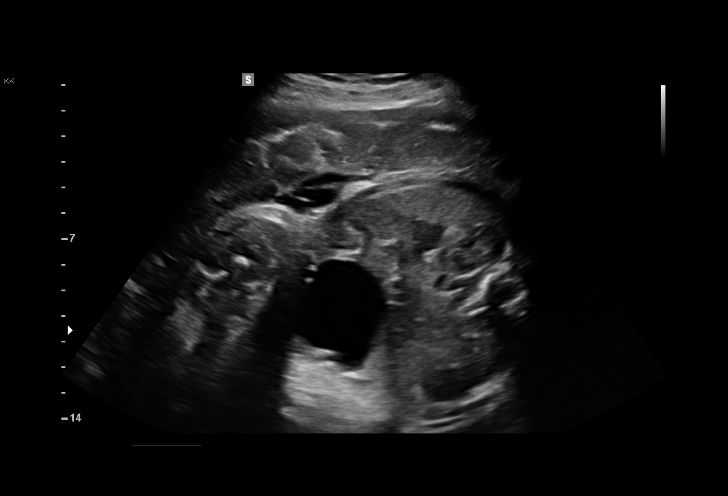
[im 6/22]
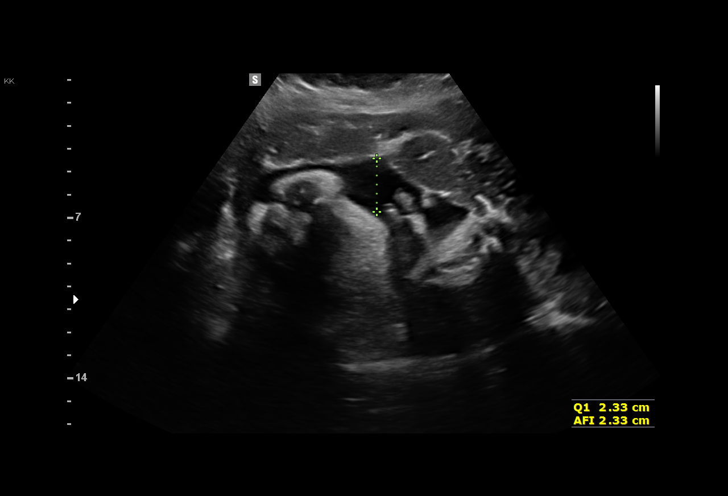
[im 7/22]
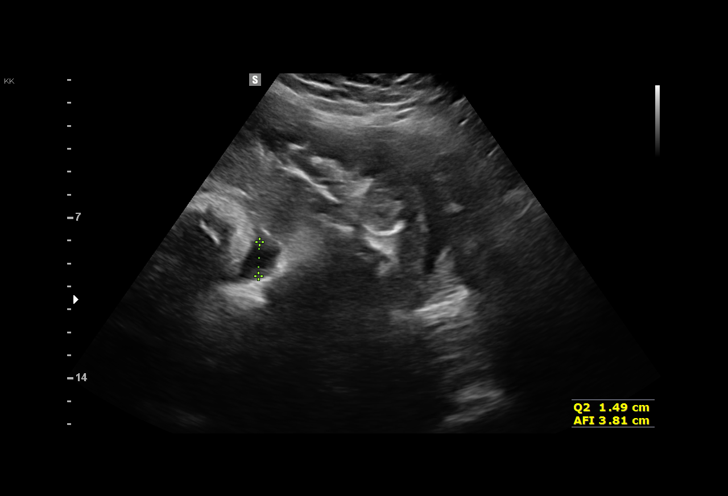
[im 9/22]
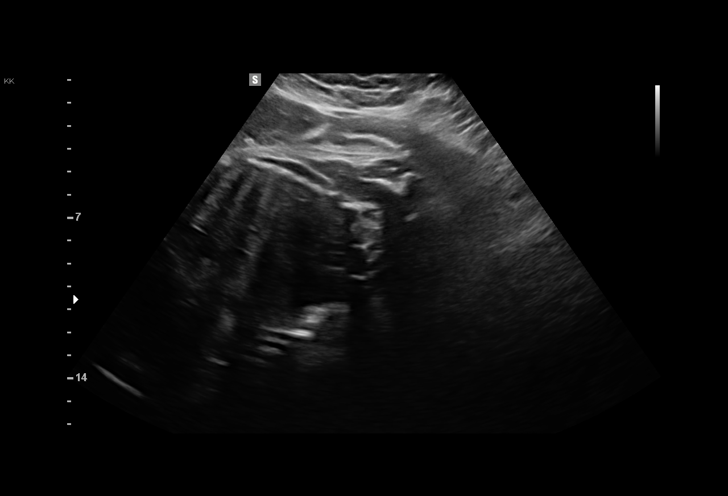
[im 10/22]
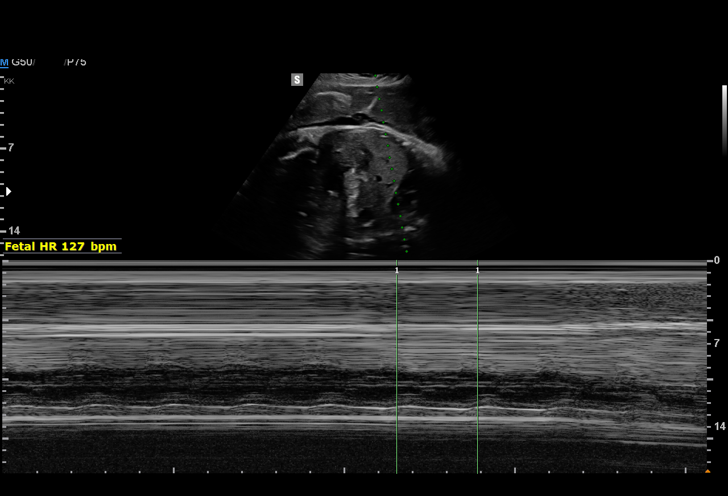
[im 12/22]
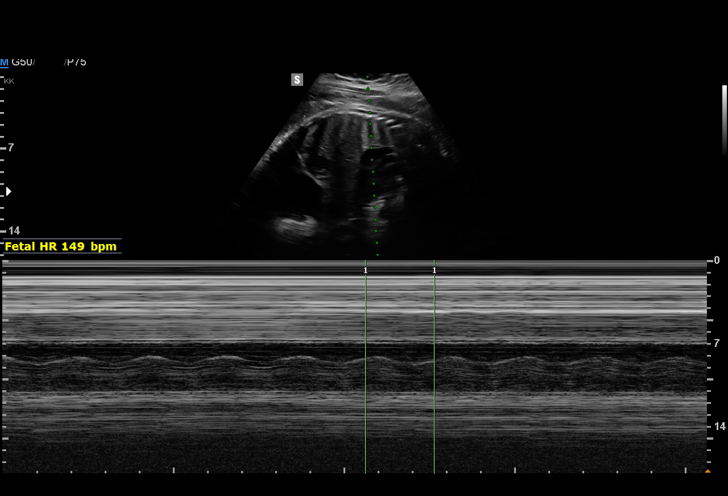
[im 13/22]
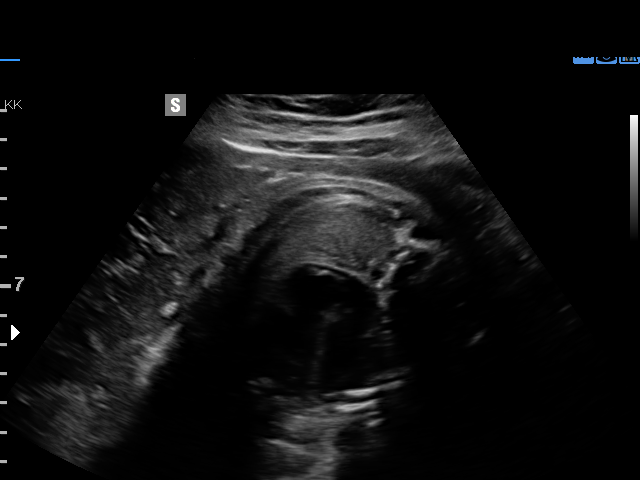
[im 14/22]
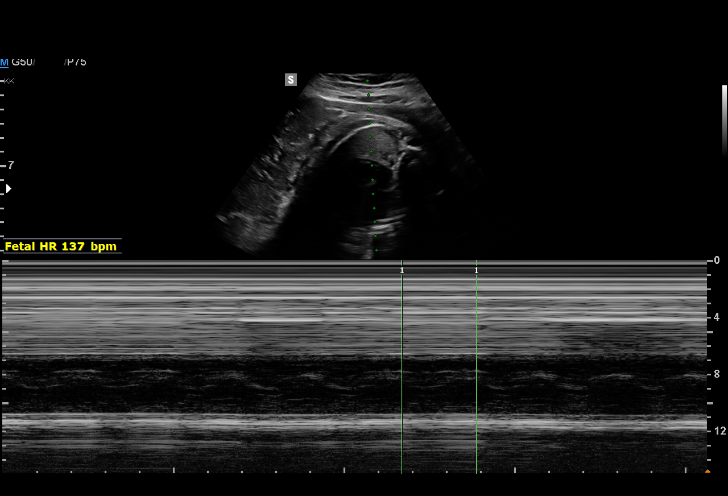
[im 16/22]
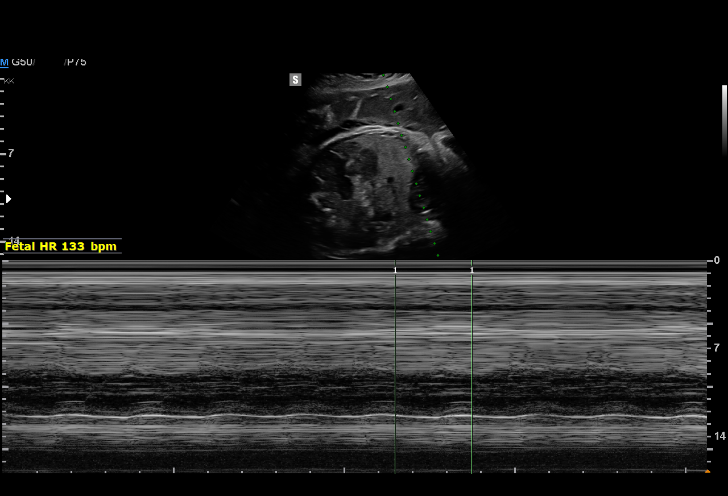
[im 17/22]
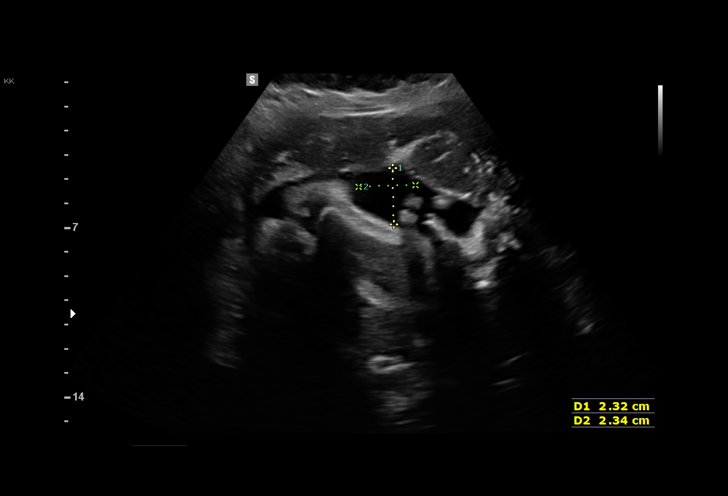
[im 19/22]
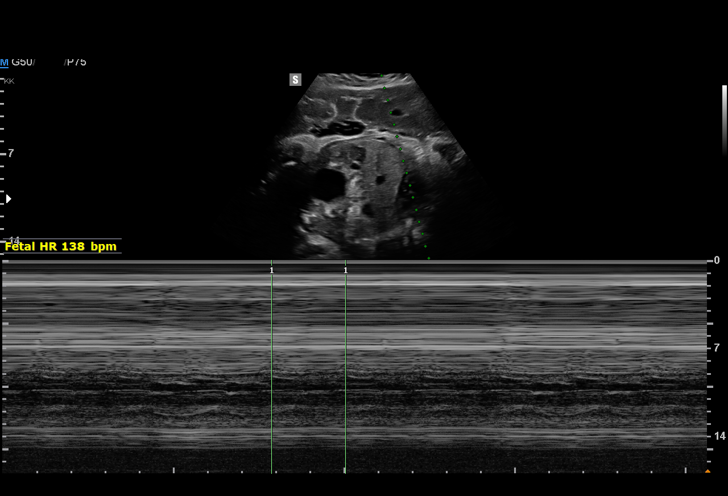
[im 20/22]
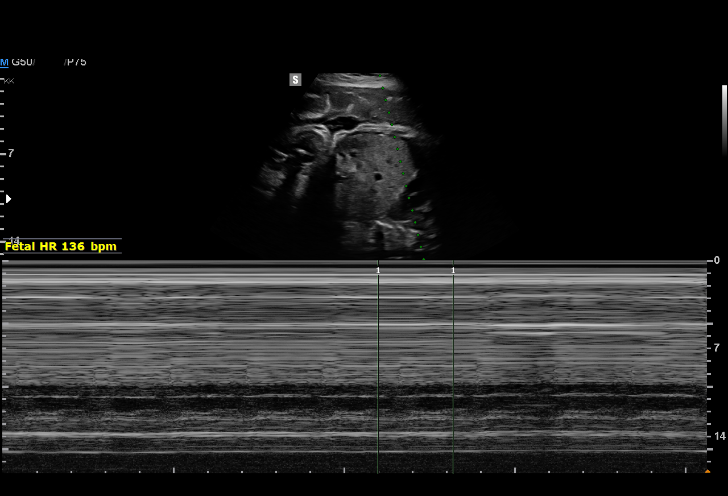
[im 22/22]
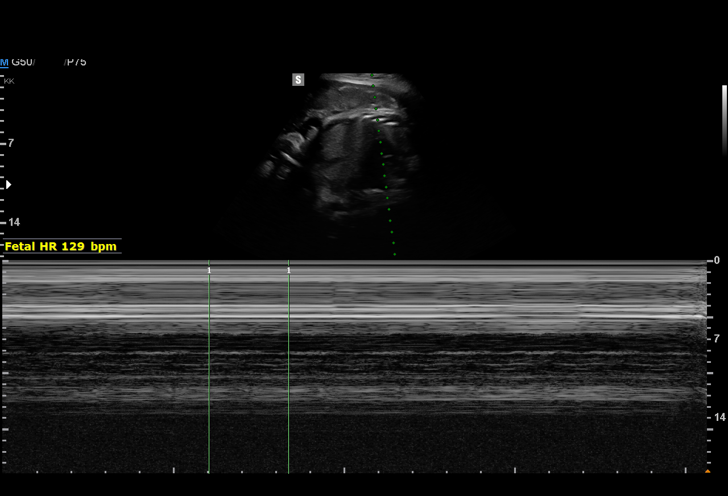

[15 of 22 positions shown; findings below may reference images not displayed]

Obstetrics &
Gynecology
[address]
[place].

1  ARISSA BILLIOT            [id number]      [id number]     [id number]
Indications

40 weeks gestation of pregnancy
Postdate pregnancy (40-42 weeks)
Obesity complicating pregnancy, third
trimester
OB History

Gravidity:    1         Term:   0        Prem:   0        SAB:   0
TOP:          0       Ectopic:  0        Living: 0
Fetal Evaluation

Num Of Fetuses:     1
Fetal Heart         133
Rate(bpm):
Cardiac Activity:   Observed
Presentation:       Cephalic

Amniotic Fluid
AFI FV:      Subjectively low-normal

AFI Sum(cm)     %Tile       Largest Pocket(cm)
6.99            5

RUQ(cm)       RLQ(cm)       LUQ(cm)
2.3
Biophysical Evaluation

Amniotic F.V:   Pocket => 2 cm two         F. Tone:        Not Observed
planes
F. Movement:    Not Observed               Score:          [DATE]
F. Breathing:   Not Observed
Gestational Age

LMP:           41w 3d        Date:  03/01/15                 EDD:   12/06/15
Best:          40w 2d     Det. By:  Early Ultrasound         EDD:   12/14/15
(05/15/15)
Impression

SIUP at 01w9d (remote read BPP only)
cephalic presentation
BPP [DATE]
AFI =6.99cm
Recommendations

Recommended delivery.

## 2017-04-30 ENCOUNTER — Encounter: Payer: Self-pay | Admitting: Medical

## 2017-04-30 ENCOUNTER — Ambulatory Visit (INDEPENDENT_AMBULATORY_CARE_PROVIDER_SITE_OTHER): Payer: BLUE CROSS/BLUE SHIELD | Admitting: Medical

## 2017-04-30 VITALS — BP 124/74 | HR 95 | Wt 199.4 lb

## 2017-04-30 DIAGNOSIS — M62838 Other muscle spasm: Secondary | ICD-10-CM

## 2017-04-30 DIAGNOSIS — M549 Dorsalgia, unspecified: Secondary | ICD-10-CM

## 2017-04-30 DIAGNOSIS — M542 Cervicalgia: Secondary | ICD-10-CM | POA: Diagnosis not present

## 2017-04-30 DIAGNOSIS — R0789 Other chest pain: Secondary | ICD-10-CM

## 2017-04-30 DIAGNOSIS — S20212D Contusion of left front wall of thorax, subsequent encounter: Secondary | ICD-10-CM | POA: Diagnosis not present
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
## 2017-06-04 ENCOUNTER — Telehealth: Payer: Self-pay | Admitting: Family Medicine

## 2017-06-04 ENCOUNTER — Telehealth: Payer: Self-pay | Admitting: Internal Medicine

## 2017-06-04 ENCOUNTER — Institutional Professional Consult (permissible substitution): Payer: BLUE CROSS/BLUE SHIELD | Admitting: Family Medicine

## 2017-06-04 NOTE — Telephone Encounter (Signed)
Spoke to patient and she states she already has FMLA for her job but her Manager is making her take short term disability for up to 9 weeks if needed but she hopes not to take that long. She is having some issues with grieving and aniexty due to her grandfather passing away and can't not get over it and can not focus while at work. She is not seeing a counselor from her last appt here as discussed as that counselor did not work out. She will find out next week what therapist she will be set up with through her work (i believe), but the STD has to be approved through her PCP per HR. Please advise

## 2017-06-04 NOTE — Telephone Encounter (Signed)
Follow as needed

## 2017-06-04 NOTE — Telephone Encounter (Signed)

## 2017-06-04 NOTE — Telephone Encounter (Signed)
Pt came in after appt stated she was delayed at her child's appt.

## 2017-06-04 NOTE — Telephone Encounter (Signed)
I believe she is also be under the care of a counselor for anxiety issues. As she and I discussed at her last visit, it will be between her and her therapist whether she needs time off from work. I cannot determine this for her and have not taken her out of work for this problem.

## 2017-06-04 NOTE — Telephone Encounter (Signed)
Again, I am unable to fill out her disability paperwork for the reason she is asking. She will need to go through a psychologist or therapist for this. I spoke with my supervising physician regarding this to confirm that this is accurate. If she has any further questions about this and you are unable to answer them we can see if Lafonda MossesDiana can help.

## 2017-06-04 NOTE — Telephone Encounter (Signed)
Pt came in late for appt. She stated she had a appt with her child that ran late. Pt was asked if she would like to reschedule and she stated that she really didn't need an appt. She states she just wanted to speak to you concerning medical leave with her job. She wanted to let you know that her job was going to be calling you concerning medical leave. Pt completed a two way communication release giving authorization to speak to them. I scanned in her chart under release.

## 2017-06-04 NOTE — Telephone Encounter (Signed)
Pt was notified.  

## 2017-07-05 ENCOUNTER — Encounter (HOSPITAL_COMMUNITY): Payer: Self-pay | Admitting: Emergency Medicine

## 2017-07-05 ENCOUNTER — Ambulatory Visit (HOSPITAL_COMMUNITY)
Admission: EM | Admit: 2017-07-05 | Discharge: 2017-07-05 | Disposition: A | Discharge status: Home / Self Care | Payer: BLUE CROSS/BLUE SHIELD | Attending: Family Medicine | Admitting: Family Medicine

## 2017-07-05 DIAGNOSIS — B9561 Methicillin susceptible Staphylococcus aureus infection as the cause of diseases classified elsewhere: Secondary | ICD-10-CM | POA: Insufficient documentation

## 2017-07-05 DIAGNOSIS — L02414 Cutaneous abscess of left upper limb: Secondary | ICD-10-CM | POA: Diagnosis not present

## 2017-07-05 DIAGNOSIS — L0291 Cutaneous abscess, unspecified: Secondary | ICD-10-CM

## 2017-07-05 DIAGNOSIS — Z1620 Resistance to unspecified antibiotic: Secondary | ICD-10-CM | POA: Diagnosis not present

## 2017-07-05 MED ORDER — CEPHALEXIN 500 MG PO CAPS: 500.0000 mg | ORAL_CAPSULE | Freq: Four times a day (QID) | ORAL | 0 refills | Status: DC

## 2017-07-05 MED ORDER — NAPROXEN 500 MG PO TABS: 500.0000 mg | ORAL_TABLET | Freq: Two times a day (BID) | ORAL | 0 refills | Status: AC

## 2017-07-05 MED ORDER — LIDOCAINE-EPINEPHRINE (PF) 2 %-1:200000 IJ SOLN
INTRAMUSCULAR | Status: AC
  Filled 2017-07-05: qty 20
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
## 2017-07-05 NOTE — ED Triage Notes (Signed)
PT has abscess to left forearm that started about 3 days ago.

## 2017-07-05 NOTE — Discharge Instructions (Signed)
Abscess drained today. Start keflex as directed for residual infection. Start naproxen as directed for pain and inflammation. Follow wound care instructions on attachment: Daily dressing, can wash wound gently with water and soap. Do no soap wound. Warm compresses as needed. Follow up with PCP or here if noticing spreading redness, increased warmth, fever.

## 2017-07-06 ENCOUNTER — Ambulatory Visit (INDEPENDENT_AMBULATORY_CARE_PROVIDER_SITE_OTHER): Payer: BLUE CROSS/BLUE SHIELD | Admitting: Medical

## 2017-07-06 ENCOUNTER — Encounter: Payer: Self-pay | Admitting: Medical

## 2017-07-06 VITALS — BP 120/70 | HR 78 | Temp 98.6°F | Wt 197.4 lb

## 2017-07-06 DIAGNOSIS — L02414 Cutaneous abscess of left upper limb: Secondary | ICD-10-CM

## 2017-07-06 NOTE — Progress Notes (Signed)
Subjective: Chief Complaint  Patient presents with  . Hospitalization Follow-up    er follow up ,abess on arm    Here for abscess f/u.   Was seen in the ED yesterday.  She has 3 day hx/o red swollen tender area of left forearm.  No speicify bite or injury or wound.   No recent sick contact with skin infection.  No prior MRSA hx/o, no hx/o abscess.   Was put on keflex.   No worse today, but not much improved.    No fever, no NVD, no other c/o.    Objective: BP 120/70   Pulse 78   Temp 98.6 F (37 C)   Wt 197 lb 6.4 oz (89.5 kg)   SpO2 98%   BMI 31.86 kg/m   Gen: wd, wn, nad Skin: left forearm with 2cm area of induration, slight erythema and warmth, tender in general, there is a 5-606mm linear surgical wound, there is about 1g or purulent discharge bloody expressed Arm neurovascularly intact   Assessment: Encounter Diagnosis  Name Primary?  . Cutaneous abscess of left upper extremity Yes    Plan: discussed the abscess, typical time frame to see improvement.  C/t keflex, warm compresses, discussed hygiene, prevention of spread to others.   Await culture   Darral Dashsther was seen today for hospitalization follow-up.  Diagnoses and all orders for this visit:  Cutaneous abscess of left upper extremity

## 2017-07-08 LAB — AEROBIC CULTURE  (SUPERFICIAL SPECIMEN): GRAM STAIN: NONE SEEN

## 2017-07-08 LAB — AEROBIC CULTURE W GRAM STAIN (SUPERFICIAL SPECIMEN)

## 2017-07-10 ENCOUNTER — Other Ambulatory Visit (HOSPITAL_COMMUNITY): Payer: Self-pay | Admitting: Emergency Medicine

## 2017-07-10 ENCOUNTER — Telehealth (HOSPITAL_COMMUNITY): Payer: Self-pay | Admitting: Emergency Medicine

## 2017-07-10 MED ORDER — SULFAMETHOXAZOLE-TRIMETHOPRIM 800-160 MG PO TABS: 1.0000 | ORAL_TABLET | Freq: Two times a day (BID) | ORAL | 0 refills | Status: DC

## 2017-07-10 NOTE — Telephone Encounter (Signed)
Called pt to notify of recent lab results.... Pt ID'd properly Reports feeling better and sx are subsiding but still having some "puss" drainage Pt is taking/tolarating well meds given at visit.  Pt requests trimethoprim/sulfa to be e-Rx to [facility] ([address])... Rx sent  Adv pt if sx are not getting better to return or to f/u w/PCP Notified pt that lab results can be obtained through MyChart Pt verb understanding.

## 2017-07-10 NOTE — Telephone Encounter (Signed)
-----   Message from Eustace MooreLaura W Murray, MD sent at 07/07/2017  8:33 PM EST ----- Clinical staff, please let patient know that abscess culture was positive for MRSA germ, not sensitive to cephalexin rx given at the urgent care visit 12/10 but sensitive to trimethoprim/sulfa. Inicision & drainage was performed at the urgent care visit, which may be curative. If abscess site has decreasing redness/swelling/pain/drainage and no fever >100.5, no change in therapy needed. If abscess site is not improving, or if there is increasing redness/swelling/pain/drainage or fever >100.5, would send rx for trimethoprim/sulfa DS 1 tab bid x 7d #14 no refills and stop cephalexin. Recheck or followup with PCP for further evaluation as needed.  LM

## 2017-07-16 ENCOUNTER — Ambulatory Visit (INDEPENDENT_AMBULATORY_CARE_PROVIDER_SITE_OTHER): Payer: BLUE CROSS/BLUE SHIELD | Admitting: Family Medicine

## 2017-07-16 ENCOUNTER — Other Ambulatory Visit: Payer: Self-pay

## 2017-07-16 ENCOUNTER — Encounter: Payer: Self-pay | Admitting: Family Medicine

## 2017-07-16 VITALS — BP 112/80 | HR 64 | Resp 18 | Wt 197.8 lb

## 2017-07-16 DIAGNOSIS — N6320 Unspecified lump in the left breast, unspecified quadrant: Secondary | ICD-10-CM | POA: Diagnosis not present

## 2017-07-16 NOTE — Progress Notes (Signed)
   Subjective:    Patient ID: [name] [name], female    DOB: 06/10/1996, 21 y.o.   MRN: [id number]  HPI 2 days ago she noted a lesion in her left breast just lateral to the nipple at 3 o'clock position.  No discharge was noted.  It is slightly uncomfortable.  A   Review of Systems     Objective:   Physical Exam 2 x 2 cm round slightly tender lesion is noted at the 3 o'clock position just lateral to the nipple on the left.  No other masses are noted.  Right breast exam is negative.       Assessment & Plan:  Breast mass, left - Plan: US BREAST LTD UNI LEFT INC AXILLA

## 2017-07-27 DIAGNOSIS — A4902 Methicillin resistant Staphylococcus aureus infection, unspecified site: Secondary | ICD-10-CM

## 2017-07-27 HISTORY — DX: Methicillin resistant Staphylococcus aureus infection, unspecified site: A49.02

## 2017-07-29 ENCOUNTER — Ambulatory Visit
Admission: RE | Admit: 2017-07-29 | Discharge: 2017-07-29 | Disposition: A | Discharge status: Home / Self Care | Payer: BLUE CROSS/BLUE SHIELD | Source: Ambulatory Visit | Attending: Family Medicine | Admitting: Family Medicine

## 2017-07-29 ENCOUNTER — Other Ambulatory Visit: Payer: Self-pay | Admitting: Family Medicine

## 2017-07-29 DIAGNOSIS — N611 Abscess of the breast and nipple: Secondary | ICD-10-CM

## 2017-08-12 ENCOUNTER — Other Ambulatory Visit: Payer: Self-pay

## 2017-08-18 ENCOUNTER — Other Ambulatory Visit: Payer: Self-pay

## 2017-08-18 ENCOUNTER — Inpatient Hospital Stay: Admission: RE | Admit: 2017-08-18 | Payer: Self-pay | Source: Ambulatory Visit

## 2017-08-18 ENCOUNTER — Other Ambulatory Visit: Payer: Self-pay | Admitting: Family Medicine

## 2017-08-18 DIAGNOSIS — N611 Abscess of the breast and nipple: Secondary | ICD-10-CM

## 2017-08-19 ENCOUNTER — Ambulatory Visit
Admission: RE | Admit: 2017-08-19 | Discharge: 2017-08-19 | Disposition: A | Discharge status: Home / Self Care | Payer: BLUE CROSS/BLUE SHIELD | Source: Ambulatory Visit | Attending: Family Medicine | Admitting: Family Medicine

## 2017-08-19 DIAGNOSIS — N611 Abscess of the breast and nipple: Secondary | ICD-10-CM

## 2017-08-23 ENCOUNTER — Encounter: Payer: Self-pay | Admitting: Family Medicine

## 2017-08-23 ENCOUNTER — Ambulatory Visit (INDEPENDENT_AMBULATORY_CARE_PROVIDER_SITE_OTHER): Payer: BLUE CROSS/BLUE SHIELD | Admitting: Family Medicine

## 2017-08-23 VITALS — BP 110/70 | HR 72 | Temp 98.2°F | Wt 197.2 lb

## 2017-08-23 DIAGNOSIS — F329 Major depressive disorder, single episode, unspecified: Secondary | ICD-10-CM

## 2017-08-23 DIAGNOSIS — F419 Anxiety disorder, unspecified: Secondary | ICD-10-CM | POA: Diagnosis not present

## 2017-08-23 DIAGNOSIS — L089 Local infection of the skin and subcutaneous tissue, unspecified: Secondary | ICD-10-CM | POA: Diagnosis not present

## 2017-08-23 DIAGNOSIS — Z22322 Carrier or suspected carrier of Methicillin resistant Staphylococcus aureus: Secondary | ICD-10-CM

## 2017-08-23 DIAGNOSIS — F32A Depression, unspecified: Secondary | ICD-10-CM

## 2017-08-23 MED ORDER — CITALOPRAM HYDROBROMIDE 20 MG PO TABS: 20.0000 mg | ORAL_TABLET | Freq: Every day | ORAL | 1 refills | Status: DC

## 2017-08-23 MED ORDER — ALPRAZOLAM 0.25 MG PO TABS: 0.2500 mg | ORAL_TABLET | Freq: Two times a day (BID) | ORAL | 0 refills | Status: DC | PRN

## 2017-08-23 MED ORDER — CHLORHEXIDINE GLUCONATE 4 % EX LIQD: Freq: Every day | CUTANEOUS | 0 refills | Status: DC | PRN

## 2017-08-23 MED ORDER — MUPIROCIN 2 % EX OINT: 1.0000 "application " | TOPICAL_OINTMENT | Freq: Two times a day (BID) | CUTANEOUS | 0 refills | Status: DC

## 2017-08-23 NOTE — Patient Instructions (Signed)
Start the medication and know that it may take up to 4 weeks for you to notice any improvement.  If you notice worsening anxiety then you may take the alprazolam as discussed. Do not drink alcohol or use marijuana or any other drugs with this medication.   We will call you with lab results and the results from your breast biopsy.   Follow up with me in 2 weeks.   Citalopram tablets What is this medicine? CITALOPRAM (sye TAL oh pram) is a medicine for depression. This medicine may be used for other purposes; ask your health care provider or pharmacist if you have questions. COMMON BRAND NAME(S): Celexa What should I tell my health care provider before I take this medicine? They need to know if you have any of these conditions: -bleeding disorders -bipolar disorder or a family history of bipolar disorder -glaucoma -heart disease -history of irregular heartbeat -kidney disease -liver disease -low levels of magnesium or potassium in the blood -receiving electroconvulsive therapy -seizures -suicidal thoughts, plans, or attempt; a previous suicide attempt by you or a family member -take medicines that treat or prevent blood clots -thyroid disease -an unusual or allergic reaction to citalopram, escitalopram, other medicines, foods, dyes, or preservatives -pregnant or trying to become pregnant -breast-feeding How should I use this medicine? Take this medicine by mouth with a glass of water. Follow the directions on the prescription label. You can take it with or without food. Take your medicine at regular intervals. Do not take your medicine more often than directed. Do not stop taking this medicine suddenly except upon the advice of your doctor. Stopping this medicine too quickly may cause serious side effects or your condition may worsen. A special MedGuide will be given to you by the pharmacist with each prescription and refill. Be sure to read this information carefully each time. Talk  to your pediatrician regarding the use of this medicine in children. Special care may be needed. Patients over 10349 years old may have a stronger reaction and need a smaller dose. Overdosage: If you think you have taken too much of this medicine contact a poison control center or emergency room at once. NOTE: This medicine is only for you. Do not share this medicine with others. What if I miss a dose? If you miss a dose, take it as soon as you can. If it is almost time for your next dose, take only that dose. Do not take double or extra doses. What may interact with this medicine? Do not take this medicine with any of the following medications: -certain medicines for fungal infections like fluconazole, itraconazole, ketoconazole, posaconazole, voriconazole -cisapride -dofetilide -dronedarone -escitalopram -linezolid -MAOIs like Carbex, Eldepryl, Marplan, Nardil, and Parnate -methylene blue (injected into a vein) -pimozide -thioridazine -ziprasidone This medicine may also interact with the following medications: -alcohol -amphetamines -aspirin and aspirin-like medicines -carbamazepine -certain medicines for depression, anxiety, or psychotic disturbances -certain medicines for infections like chloroquine, clarithromycin, erythromycin, furazolidone, isoniazid, pentamidine -certain medicines for migraine headaches like almotriptan, eletriptan, frovatriptan, naratriptan, rizatriptan, sumatriptan, zolmitriptan -certain medicines for sleep -certain medicines that treat or prevent blood clots like dalteparin, enoxaparin, warfarin -cimetidine -diuretics -fentanyl -lithium -methadone -metoprolol -NSAIDs, medicines for pain and inflammation, like ibuprofen or naproxen -omeprazole -other medicines that prolong the QT interval (cause an abnormal heart rhythm) -procarbazine -rasagiline -supplements like St. John's wort, kava kava, valerian -tramadol -tryptophan This list may not describe  all possible interactions. Give your health care provider a list of all the medicines,  herbs, non-prescription drugs, or dietary supplements you use. Also tell them if you smoke, drink alcohol, or use illegal drugs. Some items may interact with your medicine. What should I watch for while using this medicine? Tell your doctor if your symptoms do not get better or if they get worse. Visit your doctor or health care professional for regular checks on your progress. Because it may take several weeks to see the full effects of this medicine, it is important to continue your treatment as prescribed by your doctor. Patients and their families should watch out for new or worsening thoughts of suicide or depression. Also watch out for sudden changes in feelings such as feeling anxious, agitated, panicky, irritable, hostile, aggressive, impulsive, severely restless, overly excited and hyperactive, or not being able to sleep. If this happens, especially at the beginning of treatment or after a change in dose, call your health care professional. Bonita Quin may get drowsy or dizzy. Do not drive, use machinery, or do anything that needs mental alertness until you know how this medicine affects you. Do not stand or sit up quickly, especially if you are an older patient. This reduces the risk of dizzy or fainting spells. Alcohol may interfere with the effect of this medicine. Avoid alcoholic drinks. Your mouth may get dry. Chewing sugarless gum or sucking hard candy, and drinking plenty of water will help. Contact your doctor if the problem does not go away or is severe. What side effects may I notice from receiving this medicine? Side effects that you should report to your doctor or health care professional as soon as possible: -allergic reactions like skin rash, itching or hives, swelling of the face, lips, or tongue -anxious -black, tarry stools -breathing problems -changes in vision -chest pain -confusion -elevated  mood, decreased need for sleep, racing thoughts, impulsive behavior -eye pain -fast, irregular heartbeat -feeling faint or lightheaded, falls -feeling agitated, angry, or irritable -hallucination, loss of contact with reality -loss of balance or coordination -loss of memory -painful or prolonged erections -restlessness, pacing, inability to keep still -seizures -stiff muscles -suicidal thoughts or other mood changes -trouble sleeping -unusual bleeding or bruising -unusually weak or tired -vomiting Side effects that usually do not require medical attention (report to your doctor or health care professional if they continue or are bothersome): -change in appetite or weight -change in sex drive or performance -dizziness -headache -increased sweating -indigestion, nausea -tremors This list may not describe all possible side effects. Call your doctor for medical advice about side effects. You may report side effects to FDA at 1-800-FDA-1088. Where should I keep my medicine? Keep out of reach of children. Store at room temperature between 15 and 30 degrees C (59 and 86 degrees F). Throw away any unused medicine after the expiration date. NOTE: This sheet is a summary. It may not cover all possible information. If you have questions about this medicine, talk to your doctor, pharmacist, or health care provider.  2018 Elsevier/Gold Standard (2015-12-16 13:18:52)

## 2017-08-23 NOTE — Progress Notes (Signed)
   Subjective:    Patient ID: [name] [name], female    DOB: [birth year], 22 y.o.   MRN: [id number]  HPI Chief Complaint  Patient presents with  . nodule on leg    nodule on leg, discuss med- from therapist   She is here with complaints of a nodule on her right lower leg for the past few days. States the area was worse but seems to be improving. No longer having redness, tenderness.  Reports history of infected cyst on her left forearm and was seen and treated in the ED in December for this. States her wound culture was positive for MRSA.   She currently is dealing with a breast mass that is thought to be an infected sebaceous cyst. Awaiting wound culture results. This was done at the breast center.   Denies fever, chills, dizziness, chest pain, palpitations, shortness of breath, abdominal pain, N/V/D, urinary symptoms.   Reports ongoing issues with anxiety and depression. She is seeing a therapist. I did receive notes from her therapist and medication is recommended. Denies SI or HI. She reports trying Zoloft in HS and her mother states she was "zombie-like". She is requesting to try a different medication.   Reviewed allergies, medications, past medical, surgical, family, and social history.   Review of Systems Pertinent positives and negatives in the history of present illness.     Objective:   Physical Exam BP 110/70   Pulse 72   Temp 98.2 F (36.8 C) (Oral)   Wt 197 lb 3.2 oz (89.4 kg)   BMI 31.83 kg/m  Alert and in no distress. Right leg with a small, firm, round, superficial nodule on the upper posterior lower leg without erythema, edema, tenderness or drainage. No sign of infection.       Assessment & Plan:  Recurrent infection of skin - Plan: CBC with Differential/Platelet, Comprehensive metabolic panel, TSH, HIV antibody, RPR, mupirocin ointment (BACTROBAN) 2 %, chlorhexidine (HIBICLENS) 4 % external liquid  Anxiety and depression - Plan: CBC with  Differential/Platelet, Comprehensive metabolic panel, TSH, citalopram (CELEXA) 20 MG tablet, ALPRAZolam (XANAX) 0.25 MG tablet  MRSA (methicillin resistant staph aureus) culture positive  Still awaiting wound culture result performed by the breast center on her left breast. Will need to cover her with appropriate antibiotics.  No sign of any other infection today.  She is MRSA pos. Plan to treat her with bactroban and Hibiclens.  Her mother is very concerned about why patient is having these skin issues and patient and mother would like to look for underlying issue. Labs ordered.  Reviewed notes from her therapist. Will start her on citalopram. Also prescribed alprazolam if needed and advised that she may have worsening anxiety for the first week or two of the new medication. No sign of SI or HI. She has a history of daily marijuana use and I strongly advised her to avoid alcohol, marijuana or any other mind altering substances. She verbalized understanding. Recommend she continue with therapy.  Will follow up pending labs and in 2 weeks to see how she is doing on the new medication.

## 2017-08-24 LAB — AEROBIC/ANAEROBIC CULTURE (SURGICAL/DEEP WOUND)

## 2017-08-24 LAB — AEROBIC/ANAEROBIC CULTURE W GRAM STAIN (SURGICAL/DEEP WOUND)

## 2017-08-25 LAB — CBC WITH DIFFERENTIAL/PLATELET
BASOS ABS: 0 10*3/uL (ref 0.0–0.2)
BASOS: 1 %
EOS (ABSOLUTE): 0.2 10*3/uL (ref 0.0–0.4)
Eos: 2 %
HEMOGLOBIN: 12 g/dL (ref 11.1–15.9)
Hematocrit: 38.5 % (ref 34.0–46.6)
IMMATURE GRANS (ABS): 0 10*3/uL (ref 0.0–0.1)
IMMATURE GRANULOCYTES: 0 %
LYMPHS: 38 %
Lymphocytes Absolute: 2.8 10*3/uL (ref 0.7–3.1)
MCH: 30.2 pg (ref 26.6–33.0)
MCHC: 31.2 g/dL — ABNORMAL LOW (ref 31.5–35.7)
MCV: 97 fL (ref 79–97)
MONOCYTES: 10 %
Monocytes Absolute: 0.7 10*3/uL (ref 0.1–0.9)
NEUTROS PCT: 49 %
Neutrophils Absolute: 3.6 10*3/uL (ref 1.4–7.0)
PLATELETS: 331 10*3/uL (ref 150–379)
RBC: 3.97 x10E6/uL (ref 3.77–5.28)
RDW: 13.1 % (ref 12.3–15.4)
WBC: 7.3 10*3/uL (ref 3.4–10.8)

## 2017-08-25 LAB — COMPREHENSIVE METABOLIC PANEL
ALT: 6 IU/L (ref 0–32)
AST: 15 IU/L (ref 0–40)
Albumin/Globulin Ratio: 1.5 (ref 1.2–2.2)
Albumin: 4.4 g/dL (ref 3.5–5.5)
Alkaline Phosphatase: 74 IU/L (ref 39–117)
BUN/Creatinine Ratio: 9 (ref 9–23)
BUN: 6 mg/dL (ref 6–20)
Bilirubin Total: <0.2 mg/dL (ref 0.0–1.2)
CALCIUM: 9.6 mg/dL (ref 8.7–10.2)
CO2: 22 mmol/L (ref 20–29)
CREATININE: 0.7 mg/dL (ref 0.57–1.00)
Chloride: 106 mmol/L (ref 96–106)
GFR, EST AFRICAN AMERICAN: 143 mL/min/{1.73_m2} (ref >59)
GFR, EST NON AFRICAN AMERICAN: 124 mL/min/{1.73_m2} (ref >59)
GLUCOSE: 108 mg/dL — AB (ref 65–99)
Globulin, Total: 2.9 g/dL (ref 1.5–4.5)
Potassium: 4.3 mmol/L (ref 3.5–5.2)
Sodium: 144 mmol/L (ref 134–144)
TOTAL PROTEIN: 7.3 g/dL (ref 6.0–8.5)

## 2017-08-25 LAB — TSH: TSH: 0.485 u[IU]/mL (ref 0.450–4.500)

## 2017-08-25 LAB — HIV ANTIBODY (ROUTINE TESTING W REFLEX): HIV Screen 4th Generation wRfx: NONREACTIVE

## 2017-08-25 LAB — RPR: RPR: NONREACTIVE

## 2017-08-27 ENCOUNTER — Encounter: Payer: Self-pay | Admitting: Internal Medicine

## 2017-08-30 ENCOUNTER — Other Ambulatory Visit: Payer: Self-pay | Admitting: Family Medicine

## 2017-08-30 DIAGNOSIS — N611 Abscess of the breast and nipple: Secondary | ICD-10-CM

## 2017-08-31 ENCOUNTER — Other Ambulatory Visit: Payer: Self-pay | Admitting: Family Medicine

## 2017-08-31 ENCOUNTER — Telehealth: Payer: Self-pay | Admitting: Family Medicine

## 2017-08-31 DIAGNOSIS — Z22322 Carrier or suspected carrier of Methicillin resistant Staphylococcus aureus: Secondary | ICD-10-CM

## 2017-08-31 MED ORDER — SULFAMETHOXAZOLE-TRIMETHOPRIM 800-160 MG PO TABS: 1.0000 | ORAL_TABLET | Freq: Two times a day (BID) | ORAL | 0 refills | Status: DC

## 2017-08-31 NOTE — Progress Notes (Signed)
   Subjective:    Patient ID: [name] [name], female    DOB: 05/31/1996, 22 y.o.   MRN: [id number]  HPI  Yesterday I received a phone call from Angie at the breast center regarding patient's positive culture.  She was positive for rare MRSA.  Per Angie, Dr. Thomasena Edisollins requests that I start patient on an antibiotic to cover this.  This morning I received a fax with the culture and sensitivity report and prescribed Bactrim for her.  She is scheduled to follow-up with the breast center on Thursday.  We will contact the patient and let her know if the antibiotic is at her pharmacy.   Review of Systems     Objective:   Physical Exam        Assessment & Plan:

## 2017-08-31 NOTE — Progress Notes (Signed)
Pt was notified.  

## 2017-08-31 NOTE — Telephone Encounter (Signed)
Please see my note on 06/04/2017. This has not changed. Thanks.

## 2017-08-31 NOTE — Telephone Encounter (Signed)
Pt called and requested a out of work letter. She states that she was being followed by you and therapist Hurley CiscoBarbara Fousek. She states that therapist stated she would need to get the note from God[employer]ick[employer]. Per pt the dates pt missed was 11/09 -01/09.  Pt can be reached at [phone].

## 2017-08-31 NOTE — Telephone Encounter (Signed)
Reviewed chart, Vickie has not taken patient out of work.  Called pt advised her whomever took her out of work is who would need to write the letter.  She states Hurley CiscoBarbara Fousek took her out and that she was sending us info about that.  I explained we have not received anything from AldenBarbara stating that she was to be out of work.  Also,Barbara Fousek works under Dr. Evelene CroonKaur and that Dr. Evelene CroonKaur would need to write that letter if that is their determination.  Advised again we have not taken her out of work.  She said ok.  Pt was not seeing Hurley CiscoBarbara Fousek until Jan 2019.  Patient out of work since November 2018.

## 2017-09-01 NOTE — Telephone Encounter (Signed)
See telephone call

## 2017-09-02 ENCOUNTER — Ambulatory Visit
Admission: RE | Admit: 2017-09-02 | Discharge: 2017-09-02 | Disposition: A | Discharge status: Home / Self Care | Payer: BLUE CROSS/BLUE SHIELD | Source: Ambulatory Visit | Attending: Family Medicine | Admitting: Family Medicine

## 2017-09-02 ENCOUNTER — Inpatient Hospital Stay: Admission: RE | Admit: 2017-09-02 | Payer: BLUE CROSS/BLUE SHIELD | Source: Ambulatory Visit

## 2017-09-02 DIAGNOSIS — N611 Abscess of the breast and nipple: Secondary | ICD-10-CM

## 2017-09-06 ENCOUNTER — Ambulatory Visit: Payer: BLUE CROSS/BLUE SHIELD | Admitting: Family Medicine

## 2017-09-07 ENCOUNTER — Encounter: Payer: Self-pay | Admitting: Family Medicine

## 2017-09-07 ENCOUNTER — Ambulatory Visit: Payer: BLUE CROSS/BLUE SHIELD | Admitting: Family Medicine

## 2017-10-15 ENCOUNTER — Telehealth: Payer: Self-pay | Admitting: Family Medicine

## 2017-10-15 NOTE — Telephone Encounter (Signed)
Pt called and just realized that she has been dismissed from our practice so she would like to pick up a copy of all of her records. Call her when they are available.

## 2017-10-15 NOTE — Telephone Encounter (Signed)
Records printed and pt informed ready.

## 2018-03-21 ENCOUNTER — Inpatient Hospital Stay (HOSPITAL_COMMUNITY)
Admission: AD | Admit: 2018-03-21 | Discharge: 2018-03-21 | Disposition: A | Discharge status: Home / Self Care | Payer: BLUE CROSS/BLUE SHIELD | Source: Ambulatory Visit | Attending: Obstetrics and Gynecology | Admitting: Obstetrics and Gynecology

## 2018-03-21 ENCOUNTER — Other Ambulatory Visit: Payer: Self-pay

## 2018-03-21 ENCOUNTER — Encounter (HOSPITAL_COMMUNITY): Payer: Self-pay | Admitting: *Deleted

## 2018-03-21 DIAGNOSIS — A5901 Trichomonal vulvovaginitis: Secondary | ICD-10-CM | POA: Diagnosis not present

## 2018-03-21 DIAGNOSIS — R21 Rash and other nonspecific skin eruption: Secondary | ICD-10-CM

## 2018-03-21 DIAGNOSIS — R590 Localized enlarged lymph nodes: Secondary | ICD-10-CM

## 2018-03-21 DIAGNOSIS — L819 Disorder of pigmentation, unspecified: Secondary | ICD-10-CM

## 2018-03-21 DIAGNOSIS — L299 Pruritus, unspecified: Secondary | ICD-10-CM | POA: Diagnosis not present

## 2018-03-21 DIAGNOSIS — M7989 Other specified soft tissue disorders: Secondary | ICD-10-CM | POA: Diagnosis present

## 2018-03-21 HISTORY — DX: Headache, unspecified: R51.9

## 2018-03-21 HISTORY — DX: Cardiac murmur, unspecified: R01.1

## 2018-03-21 HISTORY — DX: Anemia, unspecified: D64.9

## 2018-03-21 HISTORY — DX: Headache: R51

## 2018-03-21 HISTORY — DX: Unspecified abnormal cytological findings in specimens from vagina: R87.629

## 2018-03-21 HISTORY — DX: Methicillin resistant Staphylococcus aureus infection, unspecified site: A49.02

## 2018-03-21 LAB — COMPREHENSIVE METABOLIC PANEL
ALBUMIN: 4.4 g/dL (ref 3.5–5.0)
ALK PHOS: 58 U/L (ref 38–126)
ALT: 11 U/L (ref 0–44)
AST: 15 U/L (ref 15–41)
Anion gap: 10 (ref 5–15)
BILIRUBIN TOTAL: 0.4 mg/dL (ref 0.3–1.2)
BUN: 6 mg/dL (ref 6–20)
CO2: 23 mmol/L (ref 22–32)
CREATININE: 0.63 mg/dL (ref 0.44–1.00)
Calcium: 9.2 mg/dL (ref 8.9–10.3)
Chloride: 101 mmol/L (ref 98–111)
GFR calc Af Amer: >60 mL/min (ref >60)
Glucose, Bld: 93 mg/dL (ref 70–99)
POTASSIUM: 3.5 mmol/L (ref 3.5–5.1)
Sodium: 134 mmol/L — ABNORMAL LOW (ref 135–145)
TOTAL PROTEIN: 7.6 g/dL (ref 6.5–8.1)

## 2018-03-21 LAB — CBC WITH DIFFERENTIAL/PLATELET
BASOS ABS: 0 10*3/uL (ref 0.0–0.1)
BASOS PCT: 0 %
Eosinophils Absolute: 0.3 10*3/uL (ref 0.0–0.7)
Eosinophils Relative: 3 %
HEMATOCRIT: 37.5 % (ref 36.0–46.0)
HEMOGLOBIN: 12.5 g/dL (ref 12.0–15.0)
Lymphocytes Relative: 25 %
Lymphs Abs: 2.1 10*3/uL (ref 0.7–4.0)
MCH: 31.8 pg (ref 26.0–34.0)
MCHC: 33.3 g/dL (ref 30.0–36.0)
MCV: 95.4 fL (ref 78.0–100.0)
MONO ABS: 0.3 10*3/uL (ref 0.1–1.0)
Monocytes Relative: 3 %
NEUTROS ABS: 5.6 10*3/uL (ref 1.7–7.7)
NEUTROS PCT: 69 %
Platelets: 302 10*3/uL (ref 150–400)
RBC: 3.93 MIL/uL (ref 3.87–5.11)
RDW: 12.6 % (ref 11.5–15.5)
WBC: 8.2 10*3/uL (ref 4.0–10.5)

## 2018-03-21 LAB — WET PREP, GENITAL
Clue Cells Wet Prep HPF POC: NONE SEEN
SPERM: NONE SEEN
YEAST WET PREP: NONE SEEN

## 2018-03-21 LAB — POCT PREGNANCY, URINE: PREG TEST UR: NEGATIVE

## 2018-03-21 MED ORDER — METRONIDAZOLE 500 MG PO TABS
2000.0000 mg | ORAL_TABLET | Freq: Once | ORAL | Status: AC
  Administered 2018-03-21: 2000 mg via ORAL
  Filled 2018-03-21: qty 4

## 2018-03-21 NOTE — MAU Provider Note (Signed)
Chief Complaint:  Vaginal Itching; Facial Swelling; Arm Swelling; and Leg Swelling   First Provider Initiated Contact with Patient 03/21/18 1227      HPI: [name] [name] is a 22 y.o. G1P1001 who presents to maternity admissions reporting redness and itch on right side of body since Sunday.  Took Banadryl and it did not feel better.  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  Also c/o vaginal itching.   Not much discharge. She reports vaginal bleeding, but no urinary symptoms, h/a, dizziness, n/v, or fever/chills.    Vaginal Itching  The patient's primary symptoms include genital itching, vaginal bleeding and vaginal discharge. The patient's pertinent negatives include no genital lesions, genital odor or pelvic pain. This is a new problem. The current episode started in the past 7 days. The problem occurs constantly. The problem has been unchanged. The patient is experiencing no pain. She is not pregnant. Pertinent negatives include no abdominal pain, back pain, chills, constipation, diarrhea, dysuria, fever, frequency, nausea or vomiting. The vaginal discharge was normal. The vaginal bleeding is spotting. She has not been passing clots. She has not been passing tissue. Nothing aggravates the symptoms. She has tried nothing for the symptoms. She is sexually active. It is unknown whether or not her partner has an STD.    RN Note: Having some type of reaction.  On Sunday, noted swelling on rt side of body; face arm leg.  Ate crablegs and shrimp on Saturday night.  Never had a reaction, took Benadrtyl- no relief, swelling and itching seems to be worse. Swollen areas are red, warm and tender to touch.Also wants to see if she has BV, this month she has noted an itch when she goes to the bathroom.  No pain, no burning, no d/c.  Past Medical History: Past Medical History:  Diagnosis Date  . Anemia   . Anxiety   . Asthma   . Headache   . Heart murmur   . MRSA infection 2019   left breast  . Seasonal  allergies   . Vaginal Pap smear, abnormal     Past obstetric history: OB History  Gravida Para Term Preterm AB Living  1 1 1  0 0 1  SAB TAB Ectopic Multiple Live Births  0 0 0 0 1    # Outcome Date GA Lbr Len/2nd Weight Sex Delivery Anes PTL Lv  1 Term 12/17/15 [id number]  2910 g F CS-LTranv EPI  LIV    Past Surgical History: Past Surgical History:  Procedure Laterality Date  . CESAREAN SECTION N/A 12/17/2015   Procedure: CESAREAN SECTION;  Surgeon: Geryl RankinsEvelyn Varnado, MD;  [facility];  Service: Obstetrics;  Laterality: N/A;    Family History: Family History  Problem Relation Age of Onset  . Anemia Mother   . Thyroid disease Maternal Grandmother   . Heart disease Maternal Grandfather   . Stroke Maternal Grandfather   . Glaucoma Paternal Grandmother   . Glaucoma Paternal Grandfather     Social History: Social History   Tobacco Use  . Smoking status: Never Smoker  . Smokeless tobacco: Never Used  Substance Use Topics  . Alcohol use: Yes    Comment: occ.   . Drug use: No    Allergies: No Known Allergies  Meds:  Medications Prior to Admission  Medication Sig Dispense Refill Last Dose  . ALPRAZolam (XANAX) 0.25 MG tablet Take 1 tablet (0.25 mg total) by mouth 2 (two) times daily as needed for anxiety. 20 tablet 0   .  chlorhexidine (HIBICLENS) 4 % external liquid Apply topically daily as needed. 120 mL 0   . citalopram (CELEXA) 20 MG tablet Take 1 tablet (20 mg total) by mouth daily. 30 tablet 1   . mupirocin ointment (BACTROBAN) 2 % Place 1 application into the nose 2 (two) times daily. 22 g 0   . sulfamethoxazole-trimethoprim (BACTRIM DS,SEPTRA DS) 800-160 MG tablet Take 1 tablet by mouth 2 (two) times daily. 20 tablet 0     I have reviewed patient's Past Medical Hx, Surgical Hx, Family Hx, Social Hx, medications and allergies.  ROS:  Review of Systems  Constitutional: Negative for chills and fever.  Gastrointestinal: Negative for abdominal pain,  constipation, diarrhea, nausea and vomiting.  Genitourinary: Positive for vaginal discharge. Negative for dysuria, frequency and pelvic pain.  Musculoskeletal: Negative for back pain.   Other systems negative     Physical Exam   Patient Vitals for the past 24 hrs:  BP Temp Pulse Resp SpO2 Weight  03/21/18 1029 125/87 98.6 F (37 C) 88 16 100 % 92.9 kg   Constitutional: Well-developed, well-nourished female in no acute distress.  Cardiovascular: normal rate and rhythm, no ectopy audible, S1 & S2 heard, no murmur Respiratory: normal effort, no distress. Lungs CTAB with no wheezes or crackles GI: Abd soft, non-tender.  Nondistended.  No rebound, No guarding.  Bowel Sounds audible  MS: Extremities nontender, no edema, normal ROM Neurologic: Alert and oriented x 4.   Grossly nonfocal. GU: Neg CVAT. Skin:  Warm and Dry, Erethema and edema on right face, neck, arms and thigh.  Shoddy lymphadenopathy in cervical nodes.  NO lesions, diffuse erethema and edema.  Psych:  Affect appropriate.        PELVIC EXAM: Cervix pink, visually closed, without lesion, scant white creamy discharge, vaginal walls and external genitalia normal Bimanual exam: Cervix firm, anterior, neg CMT, uterus nontender, nonenlarged, adnexa without tenderness, enlargement, or mass    Labs: Results for orders placed or performed during the hospital encounter of 03/21/18 (from the past 24 hour(s))  Pregnancy, urine POC     Status: None   Collection Time: 03/21/18 10:39 AM  Result Value Ref Range   Preg Test, Ur NEGATIVE NEGATIVE  Wet prep, genital     Status: Abnormal   Collection Time: 03/21/18 12:39 PM  Result Value Ref Range   Yeast Wet Prep HPF POC NONE SEEN NONE SEEN   Trich, Wet Prep PRESENT (A) NONE SEEN   Clue Cells Wet Prep HPF POC NONE SEEN NONE SEEN   WBC, Wet Prep HPF POC MANY (A) NONE SEEN   Sperm NONE SEEN   CBC with Differential/Platelet     Status: None   Collection Time: 03/21/18  1:13 PM    Result Value Ref Range   WBC 8.2 4.0 - 10.5 K/uL   RBC 3.93 3.87 - 5.11 MIL/uL   Hemoglobin 12.5 12.0 - 15.0 g/dL   HCT 16.1 09.6 - 04.5 %   MCV 95.4 78.0 - 100.0 fL   MCH 31.8 26.0 - 34.0 pg   MCHC 33.3 30.0 - 36.0 g/dL   RDW 40.9 81.1 - 91.4 %   Platelets 302 150 - 400 K/uL   Neutrophils Relative % 69 %   Neutro Abs 5.6 1.7 - 7.7 K/uL   Lymphocytes Relative 25 %   Lymphs Abs 2.1 0.7 - 4.0 K/uL   Monocytes Relative 3 %   Monocytes Absolute 0.3 0.1 - 1.0 K/uL   Eosinophils Relative 3 %  Eosinophils Absolute 0.3 0.0 - 0.7 K/uL   Basophils Relative 0 %   Basophils Absolute 0.0 0.0 - 0.1 K/uL  Comprehensive metabolic panel     Status: Abnormal   Collection Time: 03/21/18  1:13 PM  Result Value Ref Range   Sodium 134 (L) 135 - 145 mmol/L   Potassium 3.5 3.5 - 5.1 mmol/L   Chloride 101 98 - 111 mmol/L   CO2 23 22 - 32 mmol/L   Glucose, Bld 93 70 - 99 mg/dL   BUN 6 6 - 20 mg/dL   Creatinine, Ser 1.61 0.44 - 1.00 mg/dL   Calcium 9.2 8.9 - 09.6 mg/dL   Total Protein 7.6 6.5 - 8.1 g/dL   Albumin 4.4 3.5 - 5.0 g/dL   AST 15 15 - 41 U/L   ALT 11 0 - 44 U/L   Alkaline Phosphatase 58 38 - 126 U/L   Total Bilirubin 0.4 0.3 - 1.2 mg/dL   GFR calc non Af Amer >60 >60 mL/min   GFR calc Af Amer >60 >60 mL/min   Anion gap 10 5 - 15    Imaging:  No results found.  MAU Course/MDM: I have ordered labs as follows:CBC and CMET for use by her primary doctor in evaluating erethema.  Wet prep showed Trichomonas.  GC/Chlamydia added to urine (declined culture initially, consents to adding) Imaging ordered: none Results reviewed. Flagyl 2gm PO x 1 given.  Expedited Partner Rx for Flagyl given for her partner  Pt stable at time of discharge.  Assessment: Discoloration of skin  Cervical lymphadenopathy  Localized macular rash  Trichomonal vaginitis  Pruritus    Plan: Discharge home Recommend Call family doctor today to evaluate skin eruption Treated with single dose Flagyl,  Partner Rx given Safe sex Encouraged to return here or to other Urgent Care/ED if she develops worsening of symptoms, increase in pain, fever, or other concerning symptoms.   Wynelle Bourgeois CNM, MSN Certified Nurse-Midwife 03/21/2018 12:27 PM

## 2018-03-21 NOTE — MAU Note (Signed)
Having some type of reaction.  On Sunday, noted swelling on rt side of body; face arm leg.  Ate crablegs and shrimp on Saturday night.  Never had a reaction, took Benadrtyl- no relief, swelling and itching seems to be worse. Swollen areas are red, warm and tender to touch.Also wants to see if she has BV, this month she has noted an itch when she goes to the bathroom.  No pain, no burning, no d/c.

## 2018-03-21 NOTE — Discharge Instructions (Signed)
Rash A rash is a change in the color of the skin. A rash can also change the way your skin feels. There are many different conditions and factors that can cause a rash. Follow these instructions at home: Pay attention to any changes in your symptoms. Follow these instructions to help with your condition: Medicine Take or apply over-the-counter and prescription medicines only as told by your health care provider. These may include:  Corticosteroid cream.  Anti-itch lotions.  Oral antihistamines.  Skin Care  Apply cool compresses to the affected areas.  Try taking a bath with: ? Epsom salts. Follow the instructions on the packaging. You can get these at your local pharmacy or grocery store. ? Baking soda. Pour a small amount into the bath as told by your health care provider. ? Colloidal oatmeal. Follow the instructions on the packaging. You can get this at your local pharmacy or grocery store.  Try applying baking soda paste to your skin. Stir water into baking soda until it reaches a paste-like consistency.  Do not scratch or rub your skin.  Avoid covering the rash. Make sure the rash is exposed to air as much as possible. General instructions  Avoid hot showers or baths, which can make itching worse. A cold shower may help.  Avoid scented soaps, detergents, and perfumes. Use gentle soaps, detergents, perfumes, and other cosmetic products.  Avoid any substance that causes your rash. Keep a journal to help track what causes your rash. Write down: ? What you eat. ? What cosmetic products you use. ? What you drink. ? What you wear. This includes jewelry.  Keep all follow-up visits as told by your health care provider. This is important. Contact a health care provider if:  You sweat at night.  You lose weight.  You urinate more than normal.  You feel weak.  You vomit.  Your skin or the whites of your eyes look yellow (jaundice).  Your skin: ? Tingles. ? Is  numb.  Your rash: ? Does not go away after several days. ? Gets worse.  You are: ? Unusually thirsty. ? More tired than normal.  You have: ? New symptoms. ? Pain in your abdomen. ? A fever. ? Diarrhea. Get help right away if:  You develop a rash that covers all or most of your body. The rash may or may not be painful.  You develop blisters that: ? Are on top of the rash. ? Grow larger or grow together. ? Are painful. ? Are inside your nose or mouth.  You develop a rash that: ? Looks like purple pinprick-sized spots all over your body. ? Has a bull's eye or looks like a target. ? Is not related to sun exposure, is red and painful, and causes your skin to peel. This information is not intended to replace advice given to you by your health care provider. Make sure you discuss any questions you have with your health care provider. Document Released: 07/03/2002 Document Revised: 12/17/2015 Document Reviewed: 11/28/2014 Elsevier Interactive Patient Education  2018 [employer]. Safe Sex Practicing safe sex means taking steps before and during sex to reduce your risk of:  Getting an STD (sexually transmitted disease).  Giving your partner an STD.  Unwanted pregnancy.  How can I practice safe sex?  To practice safe sex:  Limit your sexual partners to only one partner who is having sex with only you.  Avoid using alcohol and recreational drugs before having sex. These substances can affect  your judgment.  Before having sex with a new partner: ? Talk to your partner about past partners, past STDs, and drug use. ? You and your partner should be screened for STDs and discuss the results with each other.  Check your body regularly for sores, blisters, rashes, or unusual discharge. If you notice any of these problems, visit your health care provider.  If you have symptoms of an infection or you are being treated for an STD, avoid sexual contact.  While having sex, use a  condom. Make sure to: ? Use a condom every time you have vaginal, oral, or anal sex. Both females and males should wear condoms during oral sex. ? Keep condoms in place from the beginning to the end of sexual activity. ? Use a latex condom, if possible. Latex condoms offer the best protection. ? Use only water-based lubricants or oils to lubricate a condom. Using petroleum-based lubricants or oils will weaken the condom and increase the chance that it will break.  See your health care provider for regular screenings, exams, and tests for STDs.  Talk with your health care provider about the form of birth control (contraception) that is best for you.  Get vaccinated against hepatitis B and human papillomavirus (HPV).  If you are at risk of being infected with HIV (human immunodeficiency virus), talk with your health care provider about taking a prescription medicine to prevent HIV infection. You are considered at risk for HIV if: ? You are a man who has sex with other men. ? You are a heterosexual man or woman who is sexually active with more than one partner. ? You take drugs by injection. ? You are sexually active with a partner who has HIV.  This information is not intended to replace advice given to you by your health care provider. Make sure you discuss any questions you have with your health care provider. Document Released: 08/20/2004 Document Revised: 11/27/2015 Document Reviewed: 06/02/2015 Elsevier Interactive Patient Education  2018 [employer] Trichomoniasis Trichomoniasis is an STI (sexually transmitted infection) that can affect both women and men. In women, the outer area of the female genitalia (vulva) and the vagina are affected. In men, the penis is mainly affected, but the prostate and other reproductive organs can also be involved. This condition can be treated with medicine. It often has no symptoms (is asymptomatic), especially in men. What are the causes? This  condition is caused by an organism called Trichomonas vaginalis. Trichomoniasis most often spreads from person to person (is contagious) through sexual contact. What increases the risk? The following factors may make you more likely to develop this condition:  Having unprotected sexual intercourse.  Having sexual intercourse with a partner who has trichomoniasis.  Having multiple sexual partners.  Having had previous trichomoniasis infections or other STIs.  What are the signs or symptoms? In women, symptoms of trichomoniasis include:  Abnormal vaginal discharge that is clear, white, gray, or yellow-green and foamy and has an unusual "fishy" odor.  Itching and irritation of the vagina and vulva.  Burning or pain during urination or sexual intercourse.  Genital redness and swelling.  In men, symptoms of trichomoniasis include:  Penile discharge that may be foamy or contain pus.  Pain in the penis. This may happen only when urinating.  Itching or irritation inside the penis.  Burning after urination or ejaculation.  How is this diagnosed? In women, this condition may be found during a routine Pap test or physical exam. It may  be found in men during a routine physical exam. Your health care provider may perform tests to help diagnose this infection, such as:  Urine tests (men and women).  The following in women: ? Testing the pH of the vagina. ? A vaginal swab test that checks for the Trichomonas vaginalis organism. ? Testing vaginal secretions.  Your health care provider may test you for other STIs, including HIV (human immunodeficiency virus). How is this treated? This condition is treated with medicine taken by mouth (orally), such as metronidazole or tinidazole to fight the infection. Your sexual partner(s) may also need to be tested and treated.  If you are a woman and you plan to become pregnant or think you may be pregnant, tell your health care provider right away.  Some medicines that are used to treat the infection should not be taken during pregnancy.  Your health care provider may recommend over-the-counter medicines or creams to help relieve itching or irritation. You may be tested for infection again 3 months after treatment. Follow these instructions at home:  Take and use over-the-counter and prescription medicines, including creams, only as told by your health care provider.  Do not have sexual intercourse until one week after you finish your medicine, or until your health care provider approves. Ask your health care provider when you may resume sexual intercourse.  (Women) Do not douche or wear tampons while you have the infection.  Discuss your infection with your sexual partner(s). Make sure that your partner gets tested and treated, if necessary.  Keep all follow-up visits as told by your health care provider. This is important. How is this prevented?  Use condoms every time you have sex. Using condoms correctly and consistently can help protect against STIs.  Avoid having multiple sexual partners.  Talk with your sexual partner about any symptoms that either of you may have, as well as any history of STIs.  Get tested for STIs and STDs (sexually transmitted diseases) before you have sex. Ask your partner to do the same.  Do not have sexual contact if you have symptoms of trichomoniasis or another STI. Contact a health care provider if:  You still have symptoms after you finish your medicine.  You develop pain in your abdomen.  You have pain when you urinate.  You have bleeding after sexual intercourse.  You develop a rash.  You feel nauseous or you vomit.  You plan to become pregnant or think you may be pregnant. Summary  Trichomoniasis is an STI (sexually transmitted infection) that can affect both women and men.  This condition often has no symptoms (is asymptomatic), especially in men.  You should not have sexual  intercourse until one week after you finish your medicine, or until your health care provider approves. Ask your health care provider when you may resume sexual intercourse.  Discuss your infection with your sexual partner. Make sure that your partner gets tested and treated, if necessary. This information is not intended to replace advice given to you by your health care provider. Make sure you discuss any questions you have with your health care provider. Document Released: 01/06/2001 Document Revised: 06/05/2016 Document Reviewed: 06/05/2016 Elsevier Interactive Patient Education  2017 [employer].

## 2018-03-22 LAB — HIV ANTIBODY (ROUTINE TESTING W REFLEX): HIV Screen 4th Generation wRfx: NONREACTIVE

## 2019-10-28 ENCOUNTER — Emergency Department: Payer: BC Managed Care – PPO

## 2019-10-28 ENCOUNTER — Emergency Department
Admission: EM | Admit: 2019-10-28 | Discharge: 2019-10-28 | Disposition: A | Discharge status: Home / Self Care | Payer: BC Managed Care – PPO | Attending: Emergency Medicine | Admitting: Emergency Medicine

## 2019-10-28 ENCOUNTER — Other Ambulatory Visit: Payer: Self-pay

## 2019-10-28 ENCOUNTER — Encounter: Payer: Self-pay | Admitting: Emergency Medicine

## 2019-10-28 DIAGNOSIS — R1011 Right upper quadrant pain: Secondary | ICD-10-CM | POA: Diagnosis not present

## 2019-10-28 DIAGNOSIS — J45909 Unspecified asthma, uncomplicated: Secondary | ICD-10-CM | POA: Diagnosis not present

## 2019-10-28 DIAGNOSIS — R112 Nausea with vomiting, unspecified: Secondary | ICD-10-CM

## 2019-10-28 DIAGNOSIS — E860 Dehydration: Secondary | ICD-10-CM | POA: Diagnosis not present

## 2019-10-28 DIAGNOSIS — R1010 Upper abdominal pain, unspecified: Secondary | ICD-10-CM

## 2019-10-28 LAB — COMPREHENSIVE METABOLIC PANEL
ALT: 15 U/L (ref 0–44)
AST: 22 U/L (ref 15–41)
Albumin: 5 g/dL (ref 3.5–5.0)
Alkaline Phosphatase: 56 U/L (ref 38–126)
Anion gap: 13 (ref 5–15)
BUN: 7 mg/dL (ref 6–20)
CO2: 21 mmol/L — ABNORMAL LOW (ref 22–32)
Calcium: 9.7 mg/dL (ref 8.9–10.3)
Chloride: 104 mmol/L (ref 98–111)
Creatinine, Ser: 0.65 mg/dL (ref 0.44–1.00)
GFR calc Af Amer: >60 mL/min (ref >60)
GFR calc non Af Amer: >60 mL/min (ref >60)
Glucose, Bld: 146 mg/dL — ABNORMAL HIGH (ref 70–99)
Potassium: 3.4 mmol/L — ABNORMAL LOW (ref 3.5–5.1)
Sodium: 138 mmol/L (ref 135–145)
Total Bilirubin: 0.4 mg/dL (ref 0.3–1.2)
Total Protein: 8.3 g/dL — ABNORMAL HIGH (ref 6.5–8.1)

## 2019-10-28 LAB — URINALYSIS, COMPLETE (UACMP) WITH MICROSCOPIC
Bilirubin Urine: NEGATIVE
Glucose, UA: NEGATIVE mg/dL
Hgb urine dipstick: NEGATIVE
Ketones, ur: NEGATIVE mg/dL
Leukocytes,Ua: NEGATIVE
Nitrite: NEGATIVE
Protein, ur: 100 mg/dL — AB
Specific Gravity, Urine: 1.025 (ref 1.005–1.030)
pH: 8 (ref 5.0–8.0)

## 2019-10-28 LAB — CBC
HCT: 40.7 % (ref 36.0–46.0)
Hemoglobin: 13.8 g/dL (ref 12.0–15.0)
MCH: 31.7 pg (ref 26.0–34.0)
MCHC: 33.9 g/dL (ref 30.0–36.0)
MCV: 93.6 fL (ref 80.0–100.0)
Platelets: 314 10*3/uL (ref 150–400)
RBC: 4.35 MIL/uL (ref 3.87–5.11)
RDW: 12.2 % (ref 11.5–15.5)
WBC: 7.8 10*3/uL (ref 4.0–10.5)
nRBC: 0 % (ref 0.0–0.2)

## 2019-10-28 LAB — ETHANOL: Alcohol, Ethyl (B): <10 mg/dL (ref <10)

## 2019-10-28 LAB — LIPASE, BLOOD: Lipase: 17 U/L (ref 11–51)

## 2019-10-28 LAB — PREGNANCY, URINE: Preg Test, Ur: NEGATIVE

## 2019-10-28 MED ORDER — PROMETHAZINE HCL 25 MG/ML IJ SOLN
25.0000 mg | Freq: Once | INTRAMUSCULAR | Status: AC
  Administered 2019-10-28: 25 mg via INTRAVENOUS
  Filled 2019-10-28: qty 1

## 2019-10-28 MED ORDER — DICYCLOMINE HCL 10 MG PO CAPS
10.0000 mg | ORAL_CAPSULE | Freq: Once | ORAL | Status: AC
  Administered 2019-10-28: 10 mg via ORAL
  Filled 2019-10-28: qty 1

## 2019-10-28 MED ORDER — DICYCLOMINE HCL 10 MG PO CAPS: 10.0000 mg | ORAL_CAPSULE | Freq: Four times a day (QID) | ORAL | 0 refills | Status: DC

## 2019-10-28 MED ORDER — ONDANSETRON 4 MG PO TBDP: 4.0000 mg | ORAL_TABLET | Freq: Three times a day (TID) | ORAL | 0 refills | Status: DC | PRN

## 2019-10-28 MED ORDER — SODIUM CHLORIDE 0.9 % IV BOLUS
1000.0000 mL | Freq: Once | INTRAVENOUS | Status: AC
  Administered 2019-10-28: 1000 mL via INTRAVENOUS

## 2019-10-28 MED ORDER — ONDANSETRON 4 MG PO TBDP
4.0000 mg | ORAL_TABLET | Freq: Once | ORAL | Status: AC | PRN
  Administered 2019-10-28: 4 mg via ORAL
  Filled 2019-10-28: qty 1

## 2019-10-28 NOTE — ED Notes (Signed)
First Nurse Note: Pt to ED via [facility] from home for nausea. Per EMS pt is self inducing vomiting. Pt is in NAD.
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
## 2019-10-28 NOTE — ED Notes (Signed)
Pt to desk to ask this RN if she could have recycling bin by front desk to throw up in. This RN provided patient with another emesis bag and explained that patient could not throw up in staff recycling bin.

## 2019-10-28 NOTE — Discharge Instructions (Signed)
With your regular doctor if not improving in 2 to 3 days.  Return emergency department worsening.  Take the medication as prescribed.  Drink plenty of fluids.
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
## 2019-10-28 NOTE — ED Notes (Signed)
Pt taken to [place] via wheelchair.  Called lab to make sure they run add-on urine preg.

## 2019-10-28 NOTE — ED Notes (Signed)
First Nurse Note: Pt stating to RN that she "feels like she is going to fall out". This RN took pts BP, Blood pressure 119/69. Pt informed BP was ok and they would triage her shortly. Pt then stated she could not breath. RN checked pts SpO2 sat, sat 100% on room air. Pts HR 65

## 2021-02-20 IMAGING — US US ABDOMEN LIMITED
1 series · 14 of 25 positions shown · non-contrast
Comparison: None.

CLINICAL DATA: Upper abdominal pain with nausea and vomiting

EXAM:
ULTRASOUND ABDOMEN LIMITED RIGHT UPPER QUADRANT

[Series 1: us abdomen limited ruq · 14 of 41 slices shown]
[im 1/41]
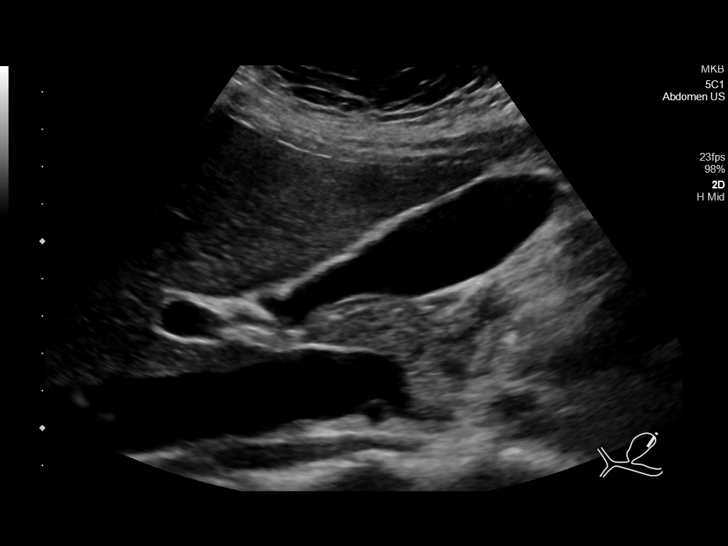
[im 4/41]
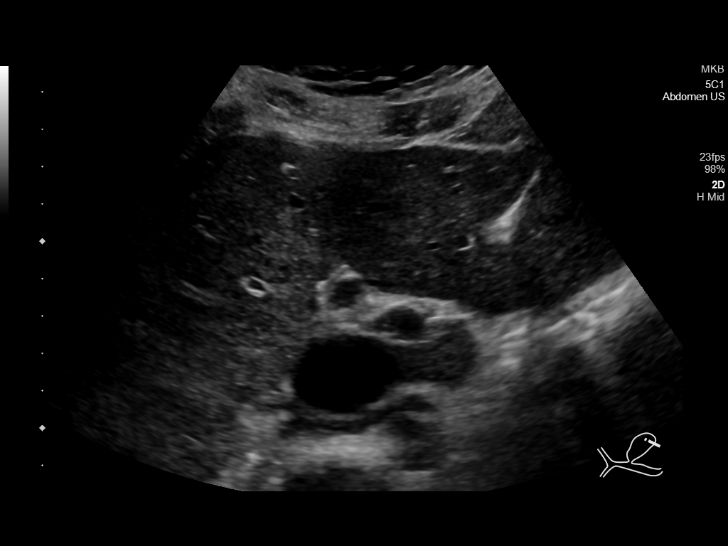
[im 7/41]
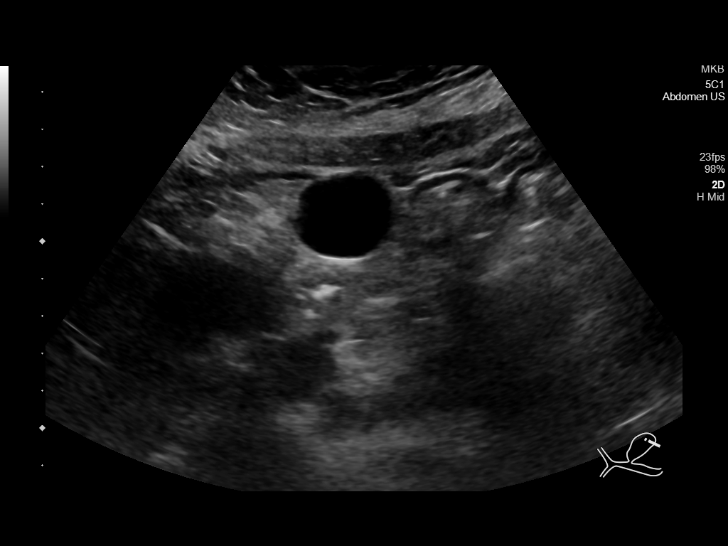
[im 11/41]
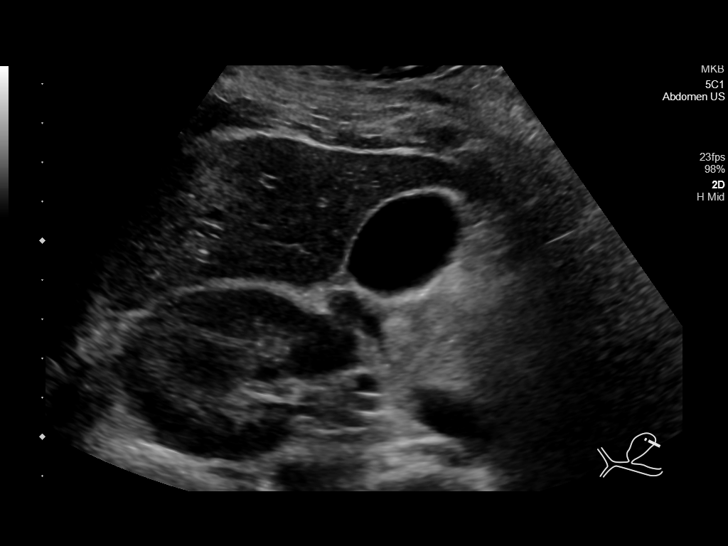
[im 14/41]
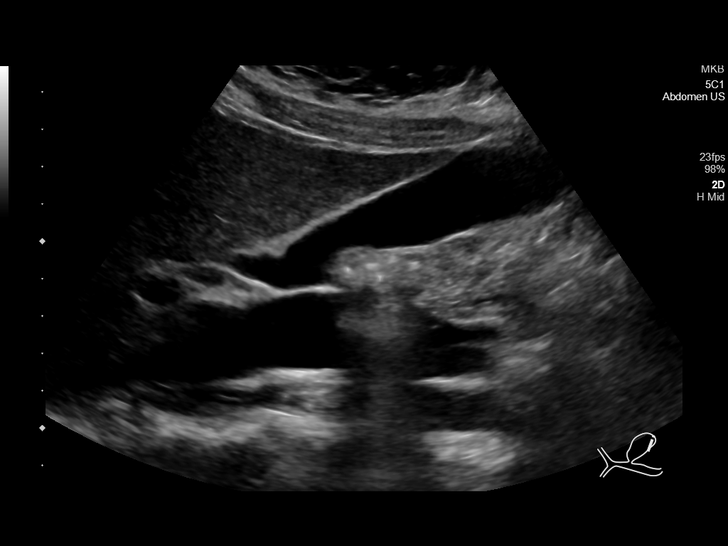
[im 16/41]
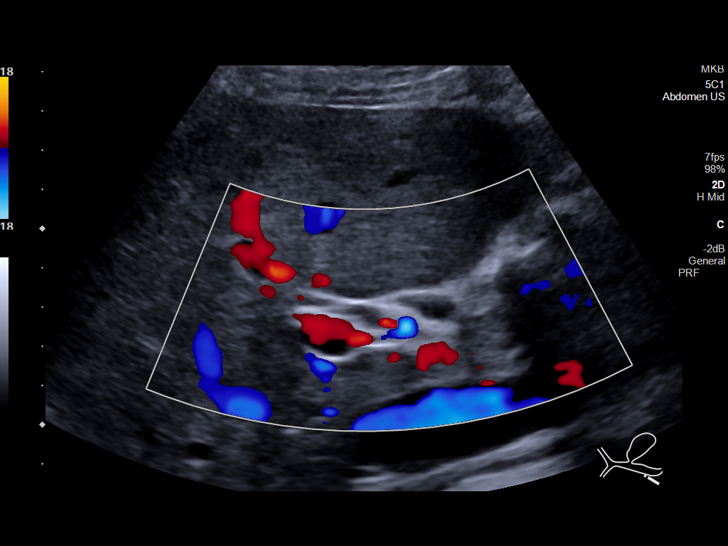
[im 19/41]
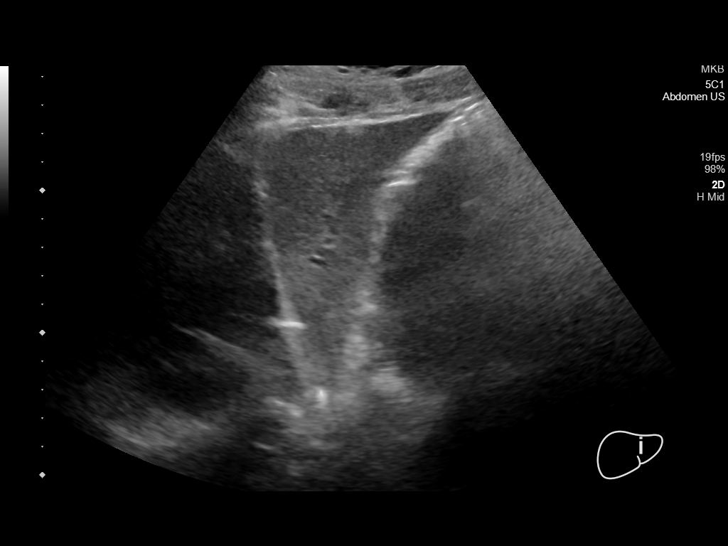
[im 22/41]
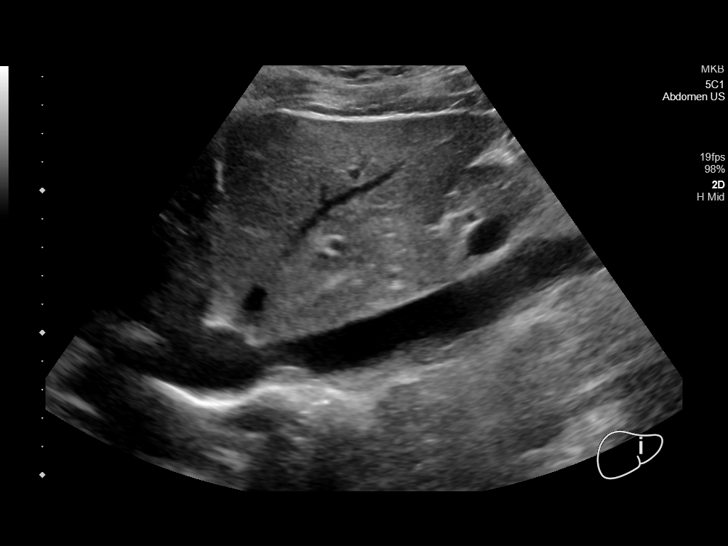
[im 26/41]
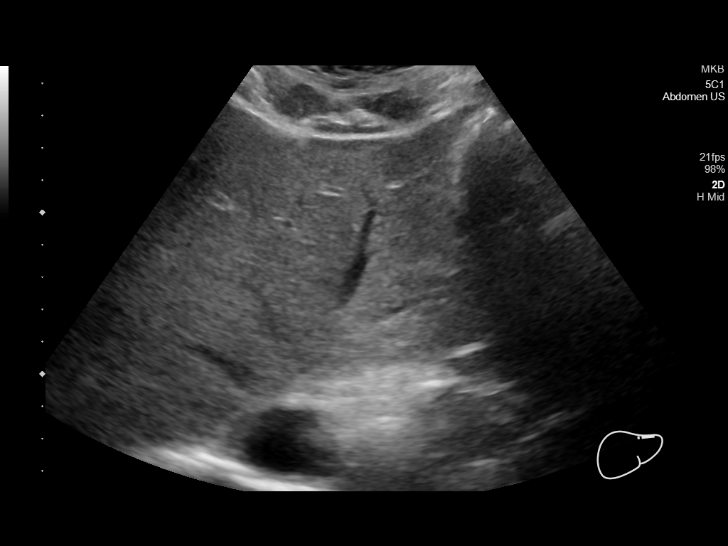
[im 27/41]
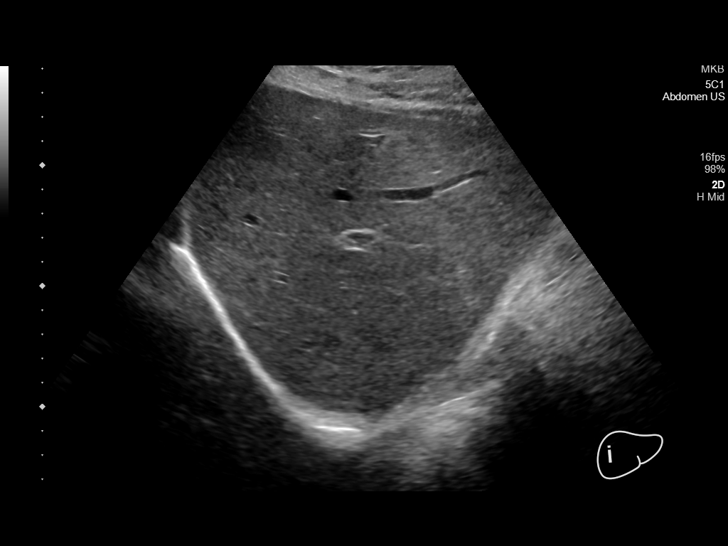
[im 31/41]
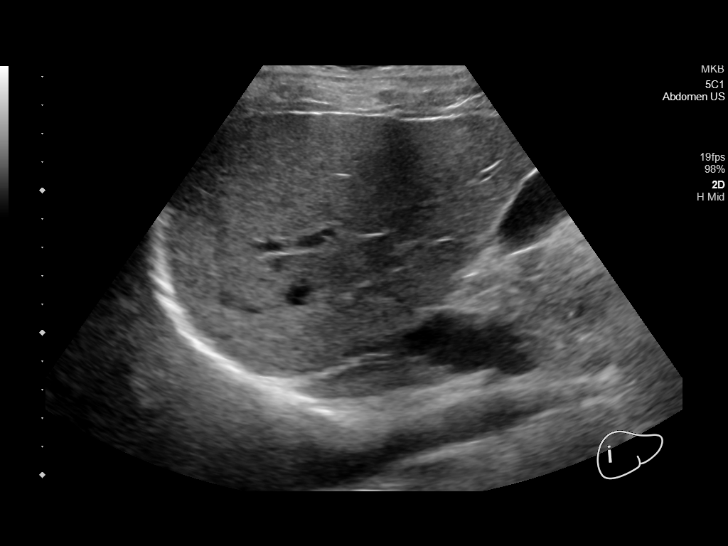
[im 34/41]
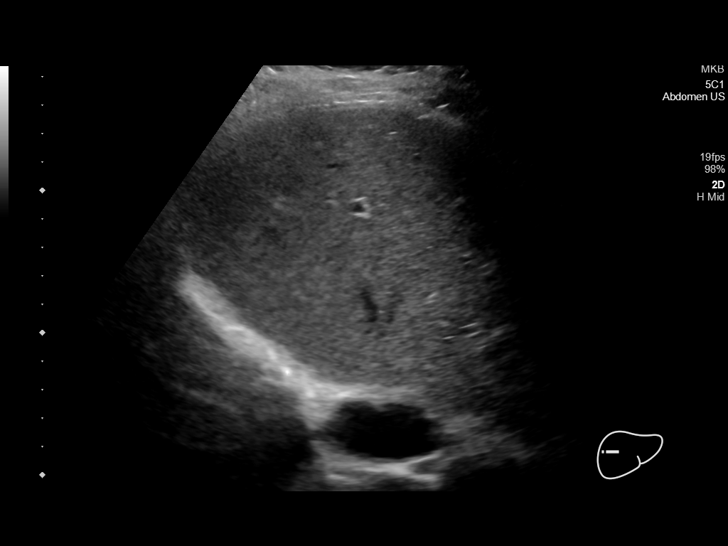
[im 37/41]
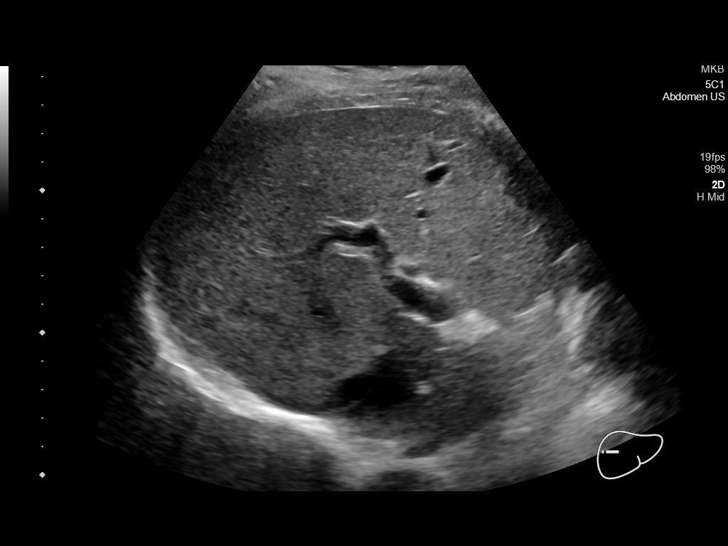
[im 41/41]
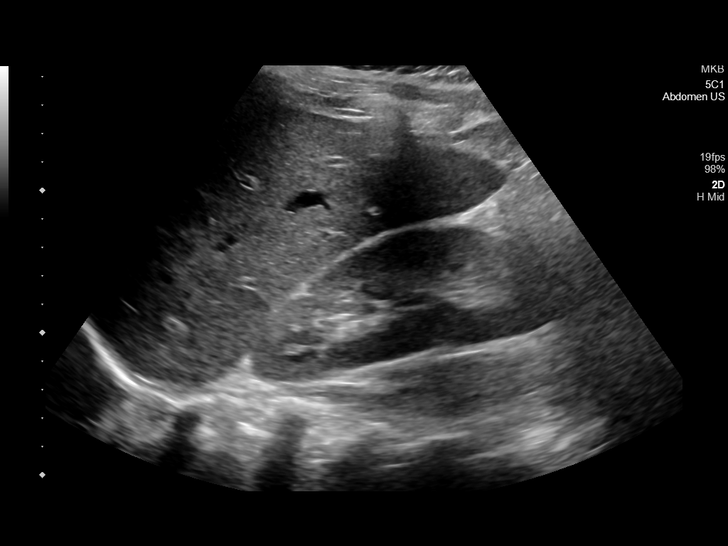

[14 of 25 positions shown; findings below may reference images not displayed]

FINDINGS: Gallbladder:

No gallstones or wall thickening visualized. There is no
pericholecystic fluid. No sonographic Murphy sign noted by
sonographer.

Common bile duct:

Diameter: 3 mm. No intrahepatic or extrahepatic biliary duct
dilatation.

Liver:

No focal lesion identified. Within normal limits in parenchymal
echogenicity. Portal vein is patent on color Doppler imaging with
normal direction of blood flow towards the liver.

Other: None.
IMPRESSION: Study within normal limits.
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
## 2023-02-17 ENCOUNTER — Ambulatory Visit (INDEPENDENT_AMBULATORY_CARE_PROVIDER_SITE_OTHER): Payer: BC Managed Care – PPO | Admitting: Internal Medicine

## 2023-02-17 VITALS — BP 154/93 | HR 74 | Resp 18 | Ht 67.0 in | Wt 257.0 lb

## 2023-02-17 DIAGNOSIS — Z6841 Body Mass Index (BMI) 40.0 and over, adult: Secondary | ICD-10-CM

## 2023-02-17 DIAGNOSIS — J4520 Mild intermittent asthma, uncomplicated: Secondary | ICD-10-CM | POA: Diagnosis not present

## 2023-02-17 DIAGNOSIS — G4710 Hypersomnia, unspecified: Secondary | ICD-10-CM | POA: Diagnosis not present

## 2023-02-17 DIAGNOSIS — F419 Anxiety disorder, unspecified: Secondary | ICD-10-CM
